# Patient Record
Sex: Male | Born: 1950 | Race: White | Hispanic: No | Marital: Married | Smoking: Former smoker
Health system: Southern US, Community
[De-identification: ages and names within clinical notes are randomized; demographics above are authoritative.]

## PROBLEM LIST (undated history)

## (undated) DIAGNOSIS — N400 Enlarged prostate without lower urinary tract symptoms: Secondary | ICD-10-CM

## (undated) DIAGNOSIS — M1990 Unspecified osteoarthritis, unspecified site: Secondary | ICD-10-CM

## (undated) DIAGNOSIS — R55 Syncope and collapse: Secondary | ICD-10-CM

## (undated) DIAGNOSIS — C20 Malignant neoplasm of rectum: Secondary | ICD-10-CM

## (undated) DIAGNOSIS — E785 Hyperlipidemia, unspecified: Secondary | ICD-10-CM

## (undated) DIAGNOSIS — K635 Polyp of colon: Secondary | ICD-10-CM

## (undated) HISTORY — DX: Malignant neoplasm of rectum: C20

## (undated) HISTORY — DX: Polyp of colon: K63.5

## (undated) HISTORY — DX: Unspecified osteoarthritis, unspecified site: M19.90

## (undated) HISTORY — PX: RECTAL SURGERY: SHX760

---

## 2005-04-22 ENCOUNTER — Encounter (INDEPENDENT_AMBULATORY_CARE_PROVIDER_SITE_OTHER): Payer: Self-pay | Admitting: *Deleted

## 2005-04-22 ENCOUNTER — Encounter (INDEPENDENT_AMBULATORY_CARE_PROVIDER_SITE_OTHER): Payer: Self-pay | Admitting: Specialist

## 2005-04-22 ENCOUNTER — Ambulatory Visit (HOSPITAL_COMMUNITY): Admission: RE | Admit: 2005-04-22 | Discharge: 2005-04-22 | Payer: Self-pay | Admitting: *Deleted

## 2006-03-31 ENCOUNTER — Encounter (INDEPENDENT_AMBULATORY_CARE_PROVIDER_SITE_OTHER): Payer: Self-pay | Admitting: Specialist

## 2006-03-31 ENCOUNTER — Ambulatory Visit (HOSPITAL_COMMUNITY): Admission: RE | Admit: 2006-03-31 | Discharge: 2006-03-31 | Payer: Self-pay | Admitting: *Deleted

## 2006-03-31 ENCOUNTER — Encounter (INDEPENDENT_AMBULATORY_CARE_PROVIDER_SITE_OTHER): Payer: Self-pay | Admitting: *Deleted

## 2006-04-06 ENCOUNTER — Encounter: Payer: Self-pay | Admitting: Gastroenterology

## 2006-04-06 ENCOUNTER — Ambulatory Visit (HOSPITAL_COMMUNITY): Admission: RE | Admit: 2006-04-06 | Discharge: 2006-04-06 | Payer: Self-pay | Admitting: Gastroenterology

## 2006-04-14 ENCOUNTER — Ambulatory Visit: Payer: Self-pay | Admitting: Gastroenterology

## 2006-04-27 ENCOUNTER — Inpatient Hospital Stay (HOSPITAL_COMMUNITY): Admission: RE | Admit: 2006-04-27 | Discharge: 2006-05-03 | Payer: Self-pay

## 2006-04-27 ENCOUNTER — Encounter (INDEPENDENT_AMBULATORY_CARE_PROVIDER_SITE_OTHER): Payer: Self-pay | Admitting: *Deleted

## 2006-04-27 ENCOUNTER — Encounter (INDEPENDENT_AMBULATORY_CARE_PROVIDER_SITE_OTHER): Payer: Self-pay | Admitting: Specialist

## 2007-04-01 ENCOUNTER — Encounter: Admission: RE | Admit: 2007-04-01 | Discharge: 2007-04-01 | Payer: Self-pay | Admitting: *Deleted

## 2007-04-05 ENCOUNTER — Ambulatory Visit (HOSPITAL_COMMUNITY): Admission: RE | Admit: 2007-04-05 | Discharge: 2007-04-05 | Payer: Self-pay | Admitting: *Deleted

## 2007-07-08 ENCOUNTER — Encounter (INDEPENDENT_AMBULATORY_CARE_PROVIDER_SITE_OTHER): Payer: Self-pay | Admitting: *Deleted

## 2007-07-08 ENCOUNTER — Ambulatory Visit (HOSPITAL_COMMUNITY): Admission: RE | Admit: 2007-07-08 | Discharge: 2007-07-08 | Payer: Self-pay | Admitting: *Deleted

## 2007-07-15 ENCOUNTER — Encounter: Admission: RE | Admit: 2007-07-15 | Discharge: 2007-07-15 | Payer: Self-pay | Admitting: *Deleted

## 2007-07-22 ENCOUNTER — Ambulatory Visit (HOSPITAL_COMMUNITY): Admission: RE | Admit: 2007-07-22 | Discharge: 2007-07-22 | Payer: Self-pay | Admitting: *Deleted

## 2007-08-15 ENCOUNTER — Encounter (INDEPENDENT_AMBULATORY_CARE_PROVIDER_SITE_OTHER): Payer: Self-pay | Admitting: Interventional Radiology

## 2007-08-15 ENCOUNTER — Encounter (INDEPENDENT_AMBULATORY_CARE_PROVIDER_SITE_OTHER): Payer: Self-pay | Admitting: *Deleted

## 2007-08-15 ENCOUNTER — Ambulatory Visit (HOSPITAL_COMMUNITY): Admission: RE | Admit: 2007-08-15 | Discharge: 2007-08-15 | Payer: Self-pay | Admitting: *Deleted

## 2008-02-17 ENCOUNTER — Encounter: Admission: RE | Admit: 2008-02-17 | Discharge: 2008-02-17 | Payer: Self-pay | Admitting: *Deleted

## 2008-07-27 ENCOUNTER — Encounter (INDEPENDENT_AMBULATORY_CARE_PROVIDER_SITE_OTHER): Payer: Self-pay | Admitting: *Deleted

## 2008-07-27 ENCOUNTER — Ambulatory Visit (HOSPITAL_COMMUNITY): Admission: RE | Admit: 2008-07-27 | Discharge: 2008-07-27 | Payer: Self-pay | Admitting: *Deleted

## 2008-09-21 ENCOUNTER — Encounter: Admission: RE | Admit: 2008-09-21 | Discharge: 2008-09-21 | Payer: Self-pay | Admitting: *Deleted

## 2009-07-17 ENCOUNTER — Encounter (INDEPENDENT_AMBULATORY_CARE_PROVIDER_SITE_OTHER): Payer: Self-pay | Admitting: *Deleted

## 2009-10-11 ENCOUNTER — Telehealth: Payer: Self-pay | Admitting: Gastroenterology

## 2009-11-22 ENCOUNTER — Encounter: Admission: RE | Admit: 2009-11-22 | Discharge: 2009-11-22 | Payer: Self-pay | Admitting: Internal Medicine

## 2009-11-22 ENCOUNTER — Encounter (INDEPENDENT_AMBULATORY_CARE_PROVIDER_SITE_OTHER): Payer: Self-pay | Admitting: *Deleted

## 2009-12-05 ENCOUNTER — Encounter: Admission: RE | Admit: 2009-12-05 | Discharge: 2009-12-05 | Payer: Self-pay | Admitting: Internal Medicine

## 2010-04-29 ENCOUNTER — Encounter (INDEPENDENT_AMBULATORY_CARE_PROVIDER_SITE_OTHER): Payer: Self-pay | Admitting: *Deleted

## 2010-05-11 ENCOUNTER — Encounter: Payer: Self-pay | Admitting: *Deleted

## 2010-05-15 ENCOUNTER — Encounter (INDEPENDENT_AMBULATORY_CARE_PROVIDER_SITE_OTHER): Payer: Self-pay | Admitting: *Deleted

## 2010-05-16 ENCOUNTER — Ambulatory Visit
Admission: RE | Admit: 2010-05-16 | Discharge: 2010-05-16 | Payer: Self-pay | Source: Home / Self Care | Attending: Gastroenterology | Admitting: Gastroenterology

## 2010-05-20 ENCOUNTER — Telehealth: Payer: Self-pay | Admitting: Gastroenterology

## 2010-05-20 NOTE — Procedures (Signed)
Summary: EUS   EUS  Procedure date:  04/02/2006  Findings:      Location: [facility]    EUS  Procedure date:  04/02/2006  Findings:      Location: [facility]   Patient Name: [name], [name] MRN: [id number] Procedure Procedures: Flex Sig with EUS Personnel: Endoscopist: Rachael Fee, MD.  Referred By: Sabino Gasser, MD.  Exam Location: Exam performed in Endoscopy Suite. Outpatient  Patient Consent: Procedure, Alternatives, Risks and Benefits discussed, consent obtained, from patient. Consent was obtained by the RN.  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  History  Current Medications: Patient is not currently taking Coumadin.  Pre-Exam Physical: Performed Apr 06, 2006. Cardio-pulmonary exam, Abdominal exam, Mental status exam WNL.  Exam Exam: Images were taken.  Patient: ASA Classification: II. Tolerance: good.  Sedation Meds:  ~OBJECTIVE5Sedation Meds Patient assessed and found to be appropriate for moderate (conscious) sedation. Fentanyl 50 mcg. given IV. Versed 5 mg. given IV.  Monitoring: BP and pulse monitoring done. Oximetry was used. Supplemental O2 given.  EUS Scopes: Radial Echoendoscope used   Comments: Sigmoidoscopic exam: 1. Non circumferential mass in rectum. This occupies approximately 1/3 of the circumference of lumen, is 4cm across, and the distal edge is 4-5cm from anal verge.  This was recently biopsied and proven to be an adenocarcinoma.  EUS exam: 1. The mass above corresponds to hypoechoic mass located along anterior rectal wall.  The mass invades into and focally expands the muscularis propria but does not clearly invade through this layer (uT3). 2. No perirectal adenopathy (uN0). Assessment Colon Abnormal examination, see findings above.  Comments: uT2N0 rectal adenocarcinoma, located 4-5cm from anal verge, occupying 1/3 the circumference of  rectum along anterior rectal wall. Events  Unplanned Intervention: No intervention was required.  Unplanned Events: There were no complications. Plans Comments: Neoadjuvant chemo-xrt is not indicated by this exam.  I will communicate this with Drs. Rico Ala. This report was created from the original endoscopy report, which was reviewed and signed by the above listed endoscopist.

## 2010-05-20 NOTE — Procedures (Signed)
Summary: Colon   Colonoscopy  Procedure date:  07/27/2008  Findings:      Location:  [facility].   NAME:  [name], [name]                  ACCOUNT NO.:  [id number]      MEDICAL RECORD NO.:  [id number]          PATIENT TYPE:  AMB      LOCATION:  ENDO                         FACILITY:  [facility] [address]      PHYSICIAN:  Georgiana Spinner, M.D.    DATE OF BIRTH:  26-Dec-1950      DATE OF PROCEDURE:   DATE OF DISCHARGE:                                  OPERATIVE REPORT      PROCEDURE:  Colonoscopy.      INDICATIONS:  Colon cancer.      ANESTHESIA:  Fentanyl 60 mcg, Versed 6 mg.      PROCEDURE:  With the patient mildly sedated in the recumbent position,   the Pentax videoscopic pediatric colonoscope was inserted into the left   lower quadrant ostomy, passed under direct vision with pressure applied   to reach the cecum, identified by the ileocecal valve and appendiceal   orifice, both of which were photographed.  From this point the   colonoscope was slowly withdrawn, taking circumferential views of the   colonic mucosa, stopping in the transverse colon, where 2 polyps were   seen nearly adjacent to each other.  One was removed using snare cautery   technique with a setting of 20/150 blended current; the other with hot   biopsy forceps technique, same setting.  Both were retrieved for   pathology.  The endoscope was then withdrawn all the way to the ostomy   and then withdrawn.  The patient's vital signs, pulse oximeter remained   stable.  The patient tolerated the procedure well without apparent   complications.      FINDINGS:  Two small polyps as described above in the transverse colon.      Await biopsy report.  The patient will call me for results and follow up   with me as needed as an outpatient.                  ______________________________   Georgiana Spinner, M.D.            GMO/MEDQ  D:  07/27/2008  T:  07/27/2008  Job:  [id number]

## 2010-05-20 NOTE — Letter (Signed)
Summary: New Patient letter  [facility]  [address] [place], [place] [address]   Phone: [phone]  Fax: [phone]       07/17/2009 MRN: [id number]  [name] [address] [place], [place]  [address]  Dear [name],  Welcome to the Gastroenterology Division at [facility].    You are scheduled to see Dr. Jarold Motto on 08-30-09 at 8:30a.m. on the 3rd floor at [facility], [address]  We ask that you try to arrive at our office 15 minutes prior to your appointment time to allow for check-in.  We would like you to complete the enclosed self-administered evaluation form prior to your visit and bring it with you on the day of your appointment.  We will review it with you.  Also, please bring a complete list of all your medications or, if you prefer, bring the medication bottles and we will list them.  Please bring your insurance card so that we may make a copy of it.  If your insurance requires a referral to see a specialist, please bring your referral form from your primary care physician.  Co-payments are due at the time of your visit and may be paid by cash, check or credit card.     Your office visit will consist of a consult with your physician (includes a physical exam), any laboratory testing he/she may order, scheduling of any necessary diagnostic testing (e.g. x-ray, ultrasound, CT-scan), and scheduling of a procedure (e.g. Endoscopy, Colonoscopy) if required.  Please allow enough time on your schedule to allow for any/all of these possibilities.    If you cannot keep your appointment, please call [phone] to cancel or reschedule prior to your appointment date.  This allows [place] the opportunity to schedule an appointment for another patient in need of care.  If you do not cancel or reschedule by 5 p.m. the business day prior to your appointment date, you will be charged a $50.00 late cancellation/no-show fee.    Thank you for choosing [facility] for your medical needs.  We appreciate the opportunity to care for you.  Please visit [place] at our website  to learn more about our practice.                     Sincerely,                                                             The Gastroenterology Division

## 2010-05-20 NOTE — Progress Notes (Signed)
Summary: triage  Phone Note Call from Patient Call back at [phone]  (wife's cell)   Caller: wife, Scarlette Calico Call For: Dr. Russella Dar Reason for Call: Talk to Nurse Summary of Call: upon resch pt from [facility] list, wife would like to speak to a nurse about pt being worked in... doesnt want husband to wait until next available in late August... wife concerned b/c she says pt is due for a CAT scan which cannot be ordered until Dr. Russella Dar sees pt Initial call taken by: Vallarie Mare,  October 11, 2009 10:19 AM  Follow-up for Phone Call        Patient  has hx with Dr Virginia Rochester and has colon CA.  He gets a CT scan annually and she reports he is due now.  Patient  wants to see Dr Russella Dar.  No current problems.  he is rescheduled for 11/14/09 10:15 Follow-up by: Darcey Nora RN, CGRN,  October 11, 2009 10:40 AM

## 2010-05-20 NOTE — Procedures (Signed)
Summary: colon   Colonoscopy  Procedure date:  03/31/2006  Findings:      Location:  [facility].   NAME:  [name], [name]                  ACCOUNT NO.:  [id number]   MEDICAL RECORD NO.:  [id number]          PATIENT TYPE:  AMB   LOCATION:  ENDO                         FACILITY:  [facility]   PHYSICIAN:  Georgiana Spinner, M.D.    DATE OF BIRTH:  Sep 04, 1950   DATE OF PROCEDURE:  DATE OF DISCHARGE:                               OPERATIVE REPORT   PROCEDURE:  Colonoscopy.   INDICATIONS:  Rectal bleeding.   ANESTHESIA:  Demerol 25 mg and Versed 1.5 mg.   DESCRIPTION OF PROCEDURE:  With the patient mildly sedated in the left  lateral decubitus position, a rectal examination was performed and at  the tip of my finger, I could feel a polypoid lesion.  Subsequently the  Olympus videoscopic colonoscope was inserted to the rectum, passed under  direct vision to the cecum, identified by ileocecal valve and  appendiceal orifice, both of which were photographed.  Adjacent to the  cecum was a polyp.  It was over a cm in size.  It was photographed and  it was removed using snare cautery technique, setting of 20/200 blended  current.  There was good residual base with all of the polyp removed and  this was photographed.  From this point, the colonoscope was then slowly  withdrawn, taking circumferential views of the colonic mucosa, stopping  next in the descending colon where another polyp was seen.  It, too, was  removed, using snare cautery technique after being photographed again  with the same setting.  The tissue was suctioned into the endoscope for  retrieval and placed in a separate bottle.  We next stopped at  approximately 25 cm from the anal verge, at which point a large polyp  was seen on a stalk and it was then snared at the stalk and removed  using snare cautery technique setting of 20/200 blended current.  This  was suctioned to the tip of the endoscope and withdrawn.  It was  retrieved.  The endoscope was reinserted to this level and withdrawn to  the rectum, where the previously felt mass was noted.  It was  photographed in direct and retroflexed views.  It seemed to be  approximately 5 cm from the anal verge where hemorrhoids were also noted  on retroflexed view.  The endoscope was straightened and subsequently  biopsies were taken.  The endoscope was withdrawn; the patient's vital  signs, pulse oximeter remained stable.  The patient tolerated the  procedure well without apparent complications.   FINDINGS:  Polyp of cecum, polyp of descending colon, polyp at 25 cm  from the anal verge and mass in the rectum.   PLAN:  Await biopsy report but presume that the latter mass in the  rectum is a malignancy.  We will proceed with CT scan of the abdomen and  pelvis and have patient call me for results of biopsy and follow up with  me as an outpatient.  ______________________________  Georgiana Spinner, M.D.     GMO/MEDQ  D:  03/31/2006  T:  04/01/2006  Job:  [id number]

## 2010-05-20 NOTE — Procedures (Signed)
Summary: EGD   EGD  Procedure date:  04/22/2005  Findings:      Location: [facility]   NAME:  [name], [name]                  ACCOUNT NO.:  [id number]   MEDICAL RECORD NO.:  [id number]          PATIENT TYPE:  AMB   LOCATION:  ENDO                         FACILITY:  [facility]   PHYSICIAN:  Georgiana Spinner, M.D.    DATE OF BIRTH:  12-Apr-1951   DATE OF PROCEDURE:  04/22/2005  DATE OF DISCHARGE:                                 OPERATIVE REPORT   PROCEDURE:  Upper endoscopy with biopsy and dilation.   INDICATIONS FOR PROCEDURE:  Dysphagia with gastroesophageal reflux disease.   ANESTHESIA:  Demerol 50, Versed 5 mg   DESCRIPTION OF PROCEDURE:  With the patient mildly sedated in the left  lateral decubitus position, the Olympus videoscopic endoscope was inserted  in the mouth and passed under direct vision through the esophagus which  appeared normal until we reached the distal esophagus and there was a  section of Barrett's photographed. We entered into the stomach. The fundus,  body, antrum, duodenal bulb, and second portion of duodenum were visualized.  From this point, the endoscope was slowly withdrawn taking circumferential  views of the duodenal mucosa until the endoscope had been pulled back into  the stomach, placed in retroflexion to view the stomach from below. The  endoscope was then straightened and a guidewire was passed. The endoscope  was withdrawn. Subsequently Savary dilators 15, 17 and 18 were passed rather  easily with no blood seen on any of the dilators. With the last, the  guidewire was removed, the endoscope was reinserted to the stomach and then  withdrawn after taking biopsies of erythematous changes of the fundus and of  the squamocolumnar junction to rule out Barrett's esophagus. The endoscope  was withdrawn. The patient's vital signs and pulse oximeter remained stable.  The patient tolerated the procedure well without apparent complications.   FINDINGS:  Question of Barrett's esophagus, erythema of gastric fundus,  dilation of distal esophagus with 15, 17 and 18 Savary.   PLAN:  Await biopsy report for clinical response. The patient will follow-up  for results and follow-up with me as an outpatient.           ______________________________  Georgiana Spinner, M.D.     GMO/MEDQ  D:  04/22/2005  T:  04/22/2005  Job:  [id number]

## 2010-05-20 NOTE — Procedures (Signed)
Summary: EGD   EGD  Procedure date:  03/31/2006  Findings:      Location: [facility]   NAME:  QUENTEN, NAWAZ                  ACCOUNT NO.:  [id number]   MEDICAL RECORD NO.:  [id number]          PATIENT TYPE:  AMB   LOCATION:  ENDO                         FACILITY:  [facility]   PHYSICIAN:  Georgiana Spinner, M.D.    DATE OF BIRTH:  1950/11/02   DATE OF PROCEDURE:  03/31/2006  DATE OF DISCHARGE:                               OPERATIVE REPORT   PROCEDURE:  Upper endoscopy.   INDICATIONS:  GERD, rule out Barrett esophagus.   ANESTHESIA:  Demerol 50, Versed 6 mg.   PROCEDURE:  With the patient mildly sedated in the left lateral  decubitus position, the Pentax videoscopic endoscope was inserted in the  mouth, passed under direct vision through the esophagus, which appeared  normal until we reached the distal esophagus.  There was a question of  Barrett esophagus, photographed and biopsies taken.  We entered into the  stomach, fundus, body, antrum, duodenal bulb, second portion of the  duodenum were visualized from this point and the scope was slowly  withdrawn, taking circumferential views of the duodenal mucosa until the  endoscope had been pulled back into the stomach, placed in retroflexion,  viewed the stomach from below.  The endoscope was straightened and  withdrawn, taking circumferential views of the remaining gastric and  esophageal mucosa.  The patient's vital signs and pulse oximeter  remained stable.  The patient tolerated the procedure well without  apparent complications.   FINDINGS:  Question of Barrett esophagus, biopsied.  Wait biopsy report.  The patient will call me for results and follow up with me as an  outpatient.  Proceed to colonoscopy is planned.           ______________________________  Georgiana Spinner, M.D.     GMO/MEDQ  D:  03/31/2006  T:  04/01/2006  Job:  [id number]

## 2010-05-20 NOTE — Op Note (Signed)
Summary: Rectal Cancer   NAME:  [name], [name]                  ACCOUNT NO.:  [id number]   MEDICAL RECORD NO.:  [id number]          PATIENT TYPE:  INP   LOCATION:  W295                         FACILITY:  [facility]   PHYSICIAN:  Lebron Conners, M.D.   DATE OF BIRTH:  05-28-1950   DATE OF PROCEDURE:  04/27/2006  DATE OF DISCHARGE:                               OPERATIVE REPORT   PREOPERATIVE DIAGNOSIS:  Carcinoma of the rectum.   POSTOPERATIVE DIAGNOSIS:  Carcinoma of the rectum.   OPERATION:  Abdominoperineal resection of the rectum with colostomy.   SURGEON:  Dr. Lebron Conners.   ASSISTANT:  Dr. Consuello Bossier.   ANESTHESIA:  General.   BLOOD LOSS:  About 150 mL.   COMPLICATIONS:  None. Patient PACU in good condition.   DESCRIPTION OF PROCEDURE:  After the patient was given general  anesthesia and was well monitored and had routine preparation and  draping of the abdomen and perineum with positioning in the yellow fin  stirrups, I made a lower midline incision from just above the umbilicus  to just above the symphysis pubis.  I took the dissection down through  subcutaneous tissue and opened the fascia longitudinally in the midline  with the Bovie and then bluntly entered the peritoneal cavity and opened  it throughout the length of the wound.  I saw no evidence of any  peritoneal metastatic disease.  I felt the liver and upper abdomen and  found no evidence of metastatic disease.  Feeling in the pelvis, I could  not feel a tumor.  This was expected to be that way because he was known  to have a low mid rectal tumor.  I had the patient positioned in  Trendelenburg position and packed the small bowel upward and then  mobilized the sigmoid colon by incising the fascia along the distal  descending colon and mobilizing the sigmoid.  I dissected in and found  the left ureter and subsequently found the right ureter as well and took  care not to harm them.  I then divided the colon  slightly distal to the  mid sigmoid using a cutting stapler.  I then segmentally divided the  mesentery straight back to the sacrum using ligatures and used the  LigaSure device.  I then dissected bluntly down the hollow of the sacrum  and relatively avascular plane.  I dissected further laterally using  primarily the LigaSure as the means of control of bleeding and division  of the tissue.  I continued the dissection downward and into the pelvis  and incised the peritoneum just anterior to the rectum in the cul-de-sac  and developed a plane of dissection immediately anterior to the rectum.  Dividing further tissues with the LigaSure anterolaterally, I mobilized  the rectum maximally and still could not definitely feel the tumor.  Dr.  Zachery Dakins then went below and performed a proctoscopy and I found that  despite extended period of dissection into the pelvis that I  simply  could not develop enough length of the rectum to place a stapling device  below the tumor.  I made the commitment then to perform an  abdominoperineal resection.  I finished the dissection from above as far  down as I could see and then exposed perineum.  I made an elliptical  incision around the rectum to dissect down to the subcutaneous tissues  and levator musculatures fat.  I dissected up toward the coccyx and  incised the fascia and muscle right at the tip of the coccyx and entered  the pelvic cavity where dissection had taken place.  I dissected down  about 3/4 of the way around the rectum utilizing the LigaSure to divide  the muscle and tissues.  When I felt I had enough room, I passed the  proximal bowel through and pulled down on both the anorectum and the  rectosigmoid and finished the dissection anteriorly by dissecting  immediately behind the prostate gland.  After I had removed the  anorectum and rectosigmoid, I checked and saw that all of the rectum was  intact and had made no holes in it.  I got  hemostasis with the cautery.  I placed two suction drains brought out through the anterolateral  perineum and secured to the skin with 2-0 silk, placed them in the  hollow of the sacrum.  I closed the levator musculature with running 2-0  Vicryl and closed the subcutaneous tissues with running 2-0 Vicryl and  closed the perineal skin with staples.  I then went back above and  positioned the drains comfortably and thoroughly irrigated the pelvis.  I packed a couple of small bleeding areas on the sacrum with Surgicel  and then I closed the peritoneum with running 2-0 Vicryl.  It closed  nicely.  I replaced the small bowel down into the pelvis.  I checked to  make sure that I had adequate mobilization of the sigmoid colon and felt  that I did.  I chose a site for the colostomy at the lateral edge of the  rectus muscle just above the level of the umbilicus and cut out about a  3 cm piece of skin and also removed some subcutaneous tissue.  I then  made a cruciate incision in the anterior rectus sheath, divided a deal  of the rectus with the cautery and then incised the posterior sheath and  dilated it up to 3 fingerbreadths.  I pulled the proximal bowel through  taking care not to twist it and it stayed up nicely.  Nevertheless, I  put two 2-0 silk sutures in the seromuscular layers of bowel and in the  anterior abdominal wall to assure that it stayed in place.  After  obtaining correct sponge, needle and instrument counts, I closed the  fascia with running #1 PDS and then irrigated the subcutaneous tissues  and closed the skin with staples.  I then matured the colostomy with a  combination of running and interrupted 3-0 Vicryl suture.  The colostomy  lay nice and flat and had no areas of apparent dyskinesia.  I placed my  finger through nicely and there seemed to be no twisting of bowel.  I  applied a colostomy appliance.  We applied a bandage to the wound and the patient went to PACU in  stable condition.      Lebron Conners, M.D.  Electronically Signed     WB/MEDQ  D:  04/27/2006  T:  04/27/2006  Job:  [id number]   cc:   Georgiana Spinner, M.D.  Fax: [phone]

## 2010-05-20 NOTE — Procedures (Signed)
Summary: Colon   Colonoscopy  Procedure date:  07/08/2007  Findings:      Location:  [facility].   NAME:  [name], [name]                  ACCOUNT NO.:  [id number]      MEDICAL RECORD NO.:  [id number]          PATIENT TYPE:  AMB      LOCATION:                               FACILITY[facility]  [facility]      PHYSICIAN:  Georgiana Spinner, M.D.    DATE OF BIRTH:  [birth year]      DATE OF PROCEDURE:   DATE OF DISCHARGE:                                  OPERATIVE REPORT      PROCEDURE:  Colonoscopy.      INDICATIONS:  Colon cancer, colon polyps.      ANESTHESIA:  Fentanyl 75 mcg, Versed 5 mg.      PROCEDURE:  With the patient mildly sedated in the left lateral   decubitus position, the Pentax videoscopic colonoscope was inserted into   the ostomy and passed under direct vision to the cecum, identified by   the ileocecal valve and appendiceal orifice.  Photographs were taken.   There was a polyp in the cecum which was removed using snare cautery   technique setting of 20/150 blended current.  Tissue was retrieved by   suctioning it through the endoscope.  From this point the colonoscope   was slowly withdrawn, taking circumferential views of the colonic   mucosa, stopping in the descending colon at 2 spots, one at 50 cm, the   other at 20 cm from the ostomy, at which 2 polyps were seen.  Both were   removed.  The distal one was removed using hot biopsy forceps technique,   the proximal using just regular biopsy forceps.  The endoscope was then   withdrawn.  The patient's vital signs and pulse oximetry remained   stable.  The patient tolerated the procedure well without apparent   complications.      FINDINGS:  Polyps as described above in the cecum at 50 cm from the anal   verge which was biopsied using the biopsy forceps, and one at 20 cm from   the ostomy which was removed using hot biopsy forceps technique.      PLAN:  Await biopsy reports.  The patient will call me for results and   follow up with me as needed as an outpatient.                  ______________________________   Georgiana Spinner, M.D.            GMO/MEDQ  D:  07/08/2007  T:  07/08/2007  Job:  [id number]

## 2010-05-22 NOTE — Miscellaneous (Signed)
Summary: [facility] Previsit/prep  Clinical Lists Changes  Medications: Added new medication of MOVIPREP 100 GM  SOLR (PEG-KCL-NACL-NASULF-NA ASC-C) As per prep instructions. - Signed Rx of MOVIPREP 100 GM  SOLR (PEG-KCL-NACL-NASULF-NA ASC-C) As per prep instructions.;  #1 x 0;  Signed;  Entered by: Wyona Almas RN;  Authorized by: Rachael Fee MD;  Method used: Electronically to [id number] [address] [place], [place]  [address], Ph: [phone], Fax: [phone] Observations: Added new observation of NKA: T (05/16/2010 13:51)    Prescriptions: MOVIPREP 100 GM  SOLR (PEG-KCL-NACL-NASULF-NA ASC-C) As per prep instructions.  #1 x 0   Entered by:   Wyona Almas RN   Authorized by:   Rachael Fee MD   Signed by:   Wyona Almas RN on 05/16/2010   Method used:   Electronically to        [id number] (retail)       [address] [place], [place]  [address]       Ph: [phone]       Fax: [phone]   RxID:   [id number]   Appended Document: [facility] Previsit/prep Pt. refused to sign cancellation agreement.  Noted on form, discussed with Dixie who will call pt. if Aurea Graff says he must sign.

## 2010-05-22 NOTE — Letter (Signed)
Summary: Pre Visit Letter Revised  [facility]  [address] [place], [place] [address]   Phone: [phone]  Fax: [phone]        04/29/2010 MRN: [id number] [name] [address] [place], [place]  [address]             Procedure Date:  05-30-10   Welcome to the Gastroenterology Division at [facility].    You are scheduled to see a nurse for your pre-procedure visit on 05-16-10 at 2:00p.m. on the 3rd floor at [facility], [address]  We ask that you try to arrive at our office 15 minutes prior to your appointment time to allow for check-in.  Please take a minute to review the attached form.  If you answer "Yes" to one or more of the questions on the first page, we ask that you call the person listed at your earliest opportunity.  If you answer "No" to all of the questions, please complete the rest of the form and bring it to your appointment.    Your nurse visit will consist of discussing your medical and surgical history, your immediate family medical history, and your medications.   If you are unable to list all of your medications on the form, please bring the medication bottles to your appointment and we will list them.  We will need to be aware of both prescribed and over the counter drugs.  We will need to know exact dosage information as well.    Please be prepared to read and sign documents such as consent forms, a financial agreement, and acknowledgement forms.  If necessary, and with your consent, a friend or relative is welcome to sit-in on the nurse visit with you.  Please bring your insurance card so that we may make a copy of it.  If your insurance requires a referral to see a specialist, please bring your referral form from your primary care physician.  No co-pay is required for this nurse visit.     If you cannot keep your appointment, please call [phone] to cancel or reschedule prior to your appointment date.  This allows [place] the  opportunity to schedule an appointment for another patient in need of care.    Thank you for choosing  Gastroenterology for your medical needs.  We appreciate the opportunity to care for you.  Please visit [place] at our website  to learn more about our practice.  Sincerely, The Gastroenterology Division

## 2010-05-22 NOTE — Letter (Signed)
Summary: [facility] Instructions  [facility]  [address] [place], [place] [address]   Phone: [phone]  Fax: [phone]       [name]    Nov 10, 1950    MRN: [id number]        Procedure Day Dorna Bloom:  Farrell Ours  05/30/10     Arrival Time:  1:30PM     Procedure Time:  2:30PM     Location of Procedure:                    Juliann Pares _  [facility] (4th Floor)                      PREPARATION FOR COLONOSCOPY WITH MOVIPREP   Starting 5 days prior to your procedure 05/25/10 do not eat nuts, seeds, popcorn, corn, beans, peas,  salads, or any raw vegetables.  Do not take any fiber supplements (e.g. Metamucil, Citrucel, and Benefiber).  THE DAY BEFORE YOUR PROCEDURE         DATE: 05/29/10  DAY: THURSDAY  1.  Drink clear liquids the entire day-NO SOLID FOOD  2.  Do not drink anything colored red or purple.  Avoid juices with pulp.  No orange juice.  3.  Drink at least 64 oz. (8 glasses) of fluid/clear liquids during the day to prevent dehydration and help the prep work efficiently.  CLEAR LIQUIDS INCLUDE: Water Jello Ice Popsicles Tea (sugar ok, no milk/cream) Powdered fruit flavored drinks Coffee (sugar ok, no milk/cream) Gatorade Juice: apple, white grape, white cranberry  Lemonade Clear bullion, consomm, broth Carbonated beverages (any kind) Strained chicken noodle soup Hard Candy                             4.  In the morning, mix first dose of MoviPrep solution:    Empty 1 Pouch A and 1 Pouch B into the disposable container    Add lukewarm drinking water to the top line of the container. Mix to dissolve    Refrigerate (mixed solution should be used within 24 hrs)  5.  Begin drinking the prep at 5:00 p.m. The MoviPrep container is divided by 4 marks.   Every 15 minutes drink the solution down to the next mark (approximately 8 oz) until the full liter is complete.   6.  Follow completed prep with 16 oz of clear liquid of your choice (Nothing red or  purple).  Continue to drink clear liquids until bedtime.  7.  Before going to bed, mix second dose of MoviPrep solution:    Empty 1 Pouch A and 1 Pouch B into the disposable container    Add lukewarm drinking water to the top line of the container. Mix to dissolve    Refrigerate  THE DAY OF YOUR PROCEDURE      DATE: 05/30/10  DAY: FRIDAY  Beginning at 9:30AM (5 hours before procedure):         1. Every 15 minutes, drink the solution down to the next mark (approx 8 oz) until the full liter is complete.  2. Follow completed prep with 16 oz. of clear liquid of your choice.    3. You may drink clear liquids until 12:30PM (2 HOURS BEFORE PROCEDURE).   MEDICATION INSTRUCTIONS  Unless otherwise instructed, you should take regular prescription medications with a small sip of water   as early as possible the morning of your  procedure.         OTHER INSTRUCTIONS  You will need a responsible adult at least 62 years of age to accompany you and drive you home.   This person must remain in the waiting room during your procedure.  Wear loose fitting clothing that is easily removed.  Leave jewelry and other valuables at home.  However, you may wish to bring a book to read or  an iPod/MP3 player to listen to music as you wait for your procedure to start.  Remove all body piercing jewelry and leave at home.  Total time from sign-in until discharge is approximately 2-3 hours.  You should go home directly after your procedure and rest.  You can resume normal activities the  day after your procedure.  The day of your procedure you should not:   Drive   Make legal decisions   Operate machinery   Drink alcohol   Return to work  You will receive specific instructions about eating, activities and medications before you leave.    The above instructions have been reviewed and explained to me by   Wyona Almas RN  May 16, 2010 2:19 PM     I fully understand and can  verbalize these instructions _____________________________ Date _________

## 2010-05-28 NOTE — Progress Notes (Signed)
Summary: prep question  Phone Note Call from Patient   Caller: Spouse Drenda Freeze Call For: Dr Christella Hartigan Reason for Call: Talk to Nurse Summary of Call: Patient would like to speak to nurse regarding prep Initial call taken by: Tawni Levy,  May 20, 2010 8:36 AM  Follow-up for Phone Call        Spoke with pt at work and he requested we cancel his appt for the colonoscopy on 05/30/10 with Dr Christella Hartigan, since he does not need to have it done until 2013. I will cancel the appt for the colonoscopy as requested per pt.Ulis Rias RN  May 20, 2010 11:02 AM

## 2010-05-30 ENCOUNTER — Other Ambulatory Visit: Payer: Self-pay | Admitting: Gastroenterology

## 2010-09-02 NOTE — Op Note (Signed)
[name], [name] NO.:  [id number]   MEDICAL RECORD NO.:  [id number]          PATIENT TYPE:  AMB   LOCATION:                               FACILITY:  [facility]   PHYSICIAN:  Georgiana Spinner, M.D.    DATE OF BIRTH:  [birth year]   DATE OF PROCEDURE:  DATE OF DISCHARGE:                               OPERATIVE REPORT   PROCEDURE:  Colonoscopy.   INDICATIONS:  Colon cancer, colon polyps.   ANESTHESIA:  Fentanyl 75 mcg, Versed 5 mg.   PROCEDURE:  With the patient mildly sedated in the left lateral  decubitus position, the Pentax videoscopic colonoscope was inserted into  the ostomy and passed under direct vision to the cecum, identified by  the ileocecal valve and appendiceal orifice.  Photographs were taken.  There was a polyp in the cecum which was removed using snare cautery  technique setting of 20/150 blended current.  Tissue was retrieved by  suctioning it through the endoscope.  From this point the colonoscope  was slowly withdrawn, taking circumferential views of the colonic  mucosa, stopping in the descending colon at 2 spots, one at 50 cm, the  other at 20 cm from the ostomy, at which 2 polyps were seen.  Both were  removed.  The distal one was removed using hot biopsy forceps technique,  the proximal using just regular biopsy forceps.  The endoscope was then  withdrawn.  The patient's vital signs and pulse oximetry remained  stable.  The patient tolerated the procedure well without apparent  complications.   FINDINGS:  Polyps as described above in the cecum at 50 cm from the anal  verge which was biopsied using the biopsy forceps, and one at 20 cm from  the ostomy which was removed using hot biopsy forceps technique.   PLAN:  Await biopsy reports.  The patient will call me for results and  follow up with me as needed as an outpatient.           ______________________________  Georgiana Spinner, M.D.     GMO/MEDQ  D:  07/08/2007  T:  07/08/2007  Job:   [id number]

## 2010-09-02 NOTE — Op Note (Signed)
[name], [name] NO.:  [id number]   MEDICAL RECORD NO.:  [id number]          PATIENT TYPE:  AMB   LOCATION:  ENDO                         FACILITY:  [facility]   PHYSICIAN:  Georgiana Spinner, M.D.    DATE OF BIRTH:  1950/09/06   DATE OF PROCEDURE:  DATE OF DISCHARGE:                               OPERATIVE REPORT   PROCEDURE:  Colonoscopy.   INDICATIONS:  Colon cancer.   ANESTHESIA:  Fentanyl 60 mcg, Versed 6 mg.   PROCEDURE:  With the patient mildly sedated in the recumbent position,  the Pentax videoscopic pediatric colonoscope was inserted into the left  lower quadrant ostomy, passed under direct vision with pressure applied  to reach the cecum, identified by the ileocecal valve and appendiceal  orifice, both of which were photographed.  From this point the  colonoscope was slowly withdrawn, taking circumferential views of the  colonic mucosa, stopping in the transverse colon, where 2 polyps were  seen nearly adjacent to each other.  One was removed using snare cautery  technique with a setting of 20/150 blended current; the other with hot  biopsy forceps technique, same setting.  Both were retrieved for  pathology.  The endoscope was then withdrawn all the way to the ostomy  and then withdrawn.  The patient's vital signs, pulse oximeter remained  stable.  The patient tolerated the procedure well without apparent  complications.   FINDINGS:  Two small polyps as described above in the transverse colon.   Await biopsy report.  The patient will call me for results and follow up  with me as needed as an outpatient.           ______________________________  Georgiana Spinner, M.D.     GMO/MEDQ  D:  07/27/2008  T:  07/27/2008  Job:  [id number]

## 2010-09-05 NOTE — Op Note (Signed)
NAMESAGAR, TENGAN                  ACCOUNT NO.:  [id number]   MEDICAL RECORD NO.:  [id number]          PATIENT TYPE:  AMB   LOCATION:  ENDO                         FACILITY:  [facility]   PHYSICIAN:  Georgiana Spinner, M.D.    DATE OF BIRTH:  1950-10-16   DATE OF PROCEDURE:  04/22/2005  DATE OF DISCHARGE:                                 OPERATIVE REPORT   PROCEDURE:  Upper endoscopy with biopsy and dilation.   INDICATIONS FOR PROCEDURE:  Dysphagia with gastroesophageal reflux disease.   ANESTHESIA:  Demerol 50, Versed 5 mg   DESCRIPTION OF PROCEDURE:  With the patient mildly sedated in the left  lateral decubitus position, the Olympus videoscopic endoscope was inserted  in the mouth and passed under direct vision through the esophagus which  appeared normal until we reached the distal esophagus and there was a  section of Barrett's photographed. We entered into the stomach. The fundus,  body, antrum, duodenal bulb, and second portion of duodenum were visualized.  From this point, the endoscope was slowly withdrawn taking circumferential  views of the duodenal mucosa until the endoscope had been pulled back into  the stomach, placed in retroflexion to view the stomach from below. The  endoscope was then straightened and a guidewire was passed. The endoscope  was withdrawn. Subsequently Savary dilators 15, 17 and 18 were passed rather  easily with no blood seen on any of the dilators. With the last, the  guidewire was removed, the endoscope was reinserted to the stomach and then  withdrawn after taking biopsies of erythematous changes of the fundus and of  the squamocolumnar junction to rule out Barrett's esophagus. The endoscope  was withdrawn. The patient's vital signs and pulse oximeter remained stable.  The patient tolerated the procedure well without apparent complications.   FINDINGS:  Question of Barrett's esophagus, erythema of gastric fundus,  dilation of distal esophagus with 15, 17  and 18 Savary.   PLAN:  Await biopsy report for clinical response. The patient will follow-up  for results and follow-up with me as an outpatient.           ______________________________  Georgiana Spinner, M.D.     GMO/MEDQ  D:  04/22/2005  T:  04/22/2005  Job:  [id number]

## 2010-09-05 NOTE — Op Note (Signed)
[name], [name] NO.:  [id number]   MEDICAL RECORD NO.:  [id number]          PATIENT TYPE:  INP   LOCATION:  X002                         FACILITY:  [facility]   PHYSICIAN:  Lebron Conners, M.D.   DATE OF BIRTH:  Oct 28, 1950   DATE OF PROCEDURE:  04/27/2006  DATE OF DISCHARGE:                               OPERATIVE REPORT   PREOPERATIVE DIAGNOSIS:  Carcinoma of the rectum.   POSTOPERATIVE DIAGNOSIS:  Carcinoma of the rectum.   OPERATION:  Abdominoperineal resection of the rectum with colostomy.   SURGEON:  Dr. Lebron Conners.   ASSISTANT:  Dr. Consuello Bossier.   ANESTHESIA:  General.   BLOOD LOSS:  About 150 mL.   COMPLICATIONS:  None. Patient PACU in good condition.   DESCRIPTION OF PROCEDURE:  After the patient was given general  anesthesia and was well monitored and had routine preparation and  draping of the abdomen and perineum with positioning in the yellow fin  stirrups, I made a lower midline incision from just above the umbilicus  to just above the symphysis pubis.  I took the dissection down through  subcutaneous tissue and opened the fascia longitudinally in the midline  with the Bovie and then bluntly entered the peritoneal cavity and opened  it throughout the length of the wound.  I saw no evidence of any  peritoneal metastatic disease.  I felt the liver and upper abdomen and  found no evidence of metastatic disease.  Feeling in the pelvis, I could  not feel a tumor.  This was expected to be that way because he was known  to have a low mid rectal tumor.  I had the patient positioned in  Trendelenburg position and packed the small bowel upward and then  mobilized the sigmoid colon by incising the fascia along the distal  descending colon and mobilizing the sigmoid.  I dissected in and found  the left ureter and subsequently found the right ureter as well and took  care not to harm them.  I then divided the colon slightly distal to the  mid sigmoid using a cutting stapler.  I then segmentally divided the  mesentery straight back to the sacrum using ligatures and used the  LigaSure device.  I then dissected bluntly down the hollow of the sacrum  and relatively avascular plane.  I dissected further laterally using  primarily the LigaSure as the means of control of bleeding and division  of the tissue.  I continued the dissection downward and into the pelvis  and incised the peritoneum just anterior to the rectum in the cul-de-sac  and developed a plane of dissection immediately anterior to the rectum.  Dividing further tissues with the LigaSure anterolaterally, I mobilized  the rectum maximally and still could not definitely feel the tumor.  Dr.  Zachery Dakins then went below and performed a proctoscopy and I found that  despite extended period of dissection into the pelvis that I  simply  could not develop enough length of the rectum to place a stapling device  below the tumor.  I made  the commitment then to perform an  abdominoperineal resection.  I finished the dissection from above as far  down as I could see and then exposed perineum.  I made an elliptical  incision around the rectum to dissect down to the subcutaneous tissues  and levator musculatures fat.  I dissected up toward the coccyx and  incised the fascia and muscle right at the tip of the coccyx and entered  the pelvic cavity where dissection had taken place.  I dissected down  about 3/4 of the way around the rectum utilizing the LigaSure to divide  the muscle and tissues.  When I felt I had enough room, I passed the  proximal bowel through and pulled down on both the anorectum and the  rectosigmoid and finished the dissection anteriorly by dissecting  immediately behind the prostate gland.  After I had removed the  anorectum and rectosigmoid, I checked and saw that all of the rectum was  intact and had made no holes in it.  I got hemostasis with the cautery.  I  placed two suction drains brought out through the anterolateral  perineum and secured to the skin with 2-0 silk, placed them in the  hollow of the sacrum.  I closed the levator musculature with running 2-0  Vicryl and closed the subcutaneous tissues with running 2-0 Vicryl and  closed the perineal skin with staples.  I then went back above and  positioned the drains comfortably and thoroughly irrigated the pelvis.  I packed a couple of small bleeding areas on the sacrum with Surgicel  and then I closed the peritoneum with running 2-0 Vicryl.  It closed  nicely.  I replaced the small bowel down into the pelvis.  I checked to  make sure that I had adequate mobilization of the sigmoid colon and felt  that I did.  I chose a site for the colostomy at the lateral edge of the  rectus muscle just above the level of the umbilicus and cut out about a  3 cm piece of skin and also removed some subcutaneous tissue.  I then  made a cruciate incision in the anterior rectus sheath, divided a deal  of the rectus with the cautery and then incised the posterior sheath and  dilated it up to 3 fingerbreadths.  I pulled the proximal bowel through  taking care not to twist it and it stayed up nicely.  Nevertheless, I  put two 2-0 silk sutures in the seromuscular layers of bowel and in the  anterior abdominal wall to assure that it stayed in place.  After  obtaining correct sponge, needle and instrument counts, I closed the  fascia with running #1 PDS and then irrigated the subcutaneous tissues  and closed the skin with staples.  I then matured the colostomy with a  combination of running and interrupted 3-0 Vicryl suture.  The colostomy  lay nice and flat and had no areas of apparent dyskinesia.  I placed my  finger through nicely and there seemed to be no twisting of bowel.  I  applied a colostomy appliance.  We applied a bandage to the wound and the patient went to PACU in stable condition.      Lebron Conners, M.D.  Electronically Signed     WB/MEDQ  D:  04/27/2006  T:  04/27/2006  Job:  [id number]   cc:   Georgiana Spinner, M.D.  Fax: [phone]

## 2010-09-05 NOTE — Discharge Summary (Signed)
NAMEJOSIAH, Holden NO.:  [id number]   MEDICAL RECORD NO.:  [id number]          PATIENT TYPE:  INP   LOCATION:  1608                         FACILITY:  [facility]   PHYSICIAN:  Lebron Conners, M.D.   DATE OF BIRTH:  [birth year]   DATE OF ADMISSION:  04/27/2006  DATE OF DISCHARGE:  05/03/2006                               DISCHARGE SUMMARY   HISTORY:  The patient is a 60 year old white male who had intermittent  rectal bleeding.  Colonoscopy was normal, except for a few benign polyps  and a small rectal cancer about 8-9 cm above the anal verge.  This was  proven by biopsy.  The patient is generally healthy, having no heart and  lung problems.  He has hyperlipidemia, which is treated by Lipitor.  The  rest of his history is unremarkable.  His physical examination is  unremarkable, but he had a palpable mass at the tip of the examining  finger, digital rectal exam.  The imaging studies and lab work and  physical examination demonstrated no evidence of any metastatic disease.  The patient was brought to the hospital for resection, which he  understood might require abdominal perineal resection.   HOSPITAL COURSE:  The patient took his bowel prep at home.  On the day  of admission, he underwent abdominal exploration and mobilization of the  colon and rectum.  The mass was found to be too low to be safely  resectable by anterior resection.  He therefore underwent abdominal  perineal resection.  Blood loss was only about 150 mL, at the time of  surgery.  He had a colostomy created in the left mid-abdomen, and it  remained healthy in appearance.  He was seen by the wound and ostomy  care service for assistance with that, during the hospitalization and  after discharge.  He had a temperature of 101 a couple days after  surgery, but wound appeared to be okay, and his white count was only  12,800.  The fever resolved without antibiotic treatment.  The perineal  and abdominal  wounds continued to look fine.  By May 03, 2006 he was  afebrile, doing well.  Abdominal wound healed very nicely.  Perineal  wound had developed infection, but there seemed to be no deep space  infection.  It was opened and packed.  He was voiding without residual.  He went home with arrangements for a home health nurse to see him daily  for dressing changes.  He was to return to see me in 3 days.  Pathology  demonstrated an invasive cancer with negative surgical margins and no  tumor in 14 perirectal lymph nodes.  There was a T2, N0, M0 tumor and  was felt to have excellent prognosis.   DIAGNOSES:  1. Carcinoma of the rectum, resected.  2. Post-operative perineal wound infection.  3. Hyperlipidemia, treated by medication.   OPERATION:  Abdominal perineal resection of the rectum with colostomy,  discharge condition improved and steadily improving further.      Lebron Conners, M.D.  Electronically Signed  WB/MEDQ  D:  06/03/2006  T:  06/03/2006  Job:  [id number]

## 2010-09-05 NOTE — Op Note (Signed)
NAMEORLANDO, [name] NO.:  [id number]   MEDICAL RECORD NO.:  [id number]          PATIENT TYPE:  AMB   LOCATION:  ENDO                         FACILITY:  [facility]   PHYSICIAN:  Georgiana Spinner, M.D.    DATE OF BIRTH:  1950-11-26   DATE OF PROCEDURE:  DATE OF DISCHARGE:                               OPERATIVE REPORT   PROCEDURE:  Colonoscopy.   INDICATIONS:  Rectal bleeding.   ANESTHESIA:  Demerol 25 mg and Versed 1.5 mg.   DESCRIPTION OF PROCEDURE:  With the patient mildly sedated in the left  lateral decubitus position, a rectal examination was performed and at  the tip of my finger, I could feel a polypoid lesion.  Subsequently the  Olympus videoscopic colonoscope was inserted to the rectum, passed under  direct vision to the cecum, identified by ileocecal valve and  appendiceal orifice, both of which were photographed.  Adjacent to the  cecum was a polyp.  It was over a cm in size.  It was photographed and  it was removed using snare cautery technique, setting of 20/200 blended  current.  There was good residual base with all of the polyp removed and  this was photographed.  From this point, the colonoscope was then slowly  withdrawn, taking circumferential views of the colonic mucosa, stopping  next in the descending colon where another polyp was seen.  It, too, was  removed, using snare cautery technique after being photographed again  with the same setting.  The tissue was suctioned into the endoscope for  retrieval and placed in a separate bottle.  We next stopped at  approximately 25 cm from the anal verge, at which point a large polyp  was seen on a stalk and it was then snared at the stalk and removed  using snare cautery technique setting of 20/200 blended current.  This  was suctioned to the tip of the endoscope and withdrawn.  It was  retrieved.  The endoscope was reinserted to this level and withdrawn to  the rectum, where the previously felt mass was  noted.  It was  photographed in direct and retroflexed views.  It seemed to be  approximately 5 cm from the anal verge where hemorrhoids were also noted  on retroflexed view.  The endoscope was straightened and subsequently  biopsies were taken.  The endoscope was withdrawn; the patient's vital  signs, pulse oximeter remained stable.  The patient tolerated the  procedure well without apparent complications.   FINDINGS:  Polyp of cecum, polyp of descending colon, polyp at 25 cm  from the anal verge and mass in the rectum.   PLAN:  Await biopsy report but presume that the latter mass in the  rectum is a malignancy.  We will proceed with CT scan of the abdomen and  pelvis and have patient call me for results of biopsy and follow up with  me as an outpatient.           ______________________________  Georgiana Spinner, M.D.     GMO/MEDQ  D:  03/31/2006  T:  04/01/2006  Job:  [id number]

## 2010-09-05 NOTE — Op Note (Signed)
[name], [name] NO.:  [id number]   MEDICAL RECORD NO.:  [id number]          PATIENT TYPE:  AMB   LOCATION:  ENDO                         FACILITY:  [facility]   PHYSICIAN:  Georgiana Spinner, M.D.    DATE OF BIRTH:  1951-02-17   DATE OF PROCEDURE:  03/31/2006  DATE OF DISCHARGE:                               OPERATIVE REPORT   PROCEDURE:  Upper endoscopy.   INDICATIONS:  GERD, rule out Barrett esophagus.   ANESTHESIA:  Demerol 50, Versed 6 mg.   PROCEDURE:  With the patient mildly sedated in the left lateral  decubitus position, the Pentax videoscopic endoscope was inserted in the  mouth, passed under direct vision through the esophagus, which appeared  normal until we reached the distal esophagus.  There was a question of  Barrett esophagus, photographed and biopsies taken.  We entered into the  stomach, fundus, body, antrum, duodenal bulb, second portion of the  duodenum were visualized from this point and the scope was slowly  withdrawn, taking circumferential views of the duodenal mucosa until the  endoscope had been pulled back into the stomach, placed in retroflexion,  viewed the stomach from below.  The endoscope was straightened and  withdrawn, taking circumferential views of the remaining gastric and  esophageal mucosa.  The patient's vital signs and pulse oximeter  remained stable.  The patient tolerated the procedure well without  apparent complications.   FINDINGS:  Question of Barrett esophagus, biopsied.  Wait biopsy report.  The patient will call me for results and follow up with me as an  outpatient.  Proceed to colonoscopy is planned.           ______________________________  Georgiana Spinner, M.D.     GMO/MEDQ  D:  03/31/2006  T:  04/01/2006  Job:  [id number]

## 2010-11-24 ENCOUNTER — Encounter: Payer: Self-pay | Admitting: Gastroenterology

## 2010-12-29 ENCOUNTER — Ambulatory Visit: Payer: Self-pay | Admitting: Gastroenterology

## 2011-01-13 LAB — CBC
HCT: 43.1
Platelets: 239
RBC: 4.88
WBC: 7.5

## 2011-01-26 ENCOUNTER — Ambulatory Visit (INDEPENDENT_AMBULATORY_CARE_PROVIDER_SITE_OTHER): Payer: BC Managed Care – PPO | Admitting: Gastroenterology

## 2011-01-26 ENCOUNTER — Encounter: Payer: Self-pay | Admitting: Gastroenterology

## 2011-01-26 DIAGNOSIS — R1310 Dysphagia, unspecified: Secondary | ICD-10-CM

## 2011-01-26 DIAGNOSIS — T17308A Unspecified foreign body in larynx causing other injury, initial encounter: Secondary | ICD-10-CM

## 2011-01-26 DIAGNOSIS — C20 Malignant neoplasm of rectum: Secondary | ICD-10-CM

## 2011-01-26 DIAGNOSIS — IMO0002 Reserved for concepts with insufficient information to code with codable children: Secondary | ICD-10-CM

## 2011-01-26 NOTE — Progress Notes (Signed)
  HPI: This is a very pleasant 60 year old man  60 year old man who was diagnosed with rectal adenocarcinoma in 2007 by Dr. Sabino Gasser.  I performed an endoscopic ultrasound and staged it as a T2 N0 lesion. He went for surgical resection without neoadjuvant chemoradiation and was found on pathology to also have a T2 N0 lesion with 14 negative nodes out of 14. Surgery was in January 2008. He underwent colonoscopy March 2009 by Dr. Virginia Rochester and 2 small tubular adenomas were removed. He underwent another colonoscopy April 2010 by Dr. Virginia Rochester and one small adenoma was removed.  He has a perirectal lymph node biopsied by interventional radiology April 2009 and it was shown to have fibrosis only.  Chokes, coughs dramatically. This will happen with 4-5 times in a week.  He does not actually have dysphagia. He has pyrosis, takes PPI periodically with good effect. The pyrosis is often related to types of food.  He calls it strangling.  Maybe meats cause it more, but liquids can as.  He had an EGD with Dr. Virginia Rochester in 2007, his esophagus was dilated, per the patient. This was done for the same sensation he is having now and he tells me that he had complete relief of his symptoms for many years.  Overall weight is stable.  No overt GI bleeding.  Colostomy functions well.     Review of systems: Pertinent positive and negative review of systems were noted in the above HPI section.  All other review of systems was otherwise negative.   Past Medical History  Diagnosis Date  . Rectal cancer   . Colon polyp   . Barrett's esophagus     Past Surgical History  Procedure Date  . Rectal surgery      reports that he has never smoked. He has never used smokeless tobacco. He reports that he drinks alcohol. He reports that he does not use illicit drugs.  family history includes Hyperlipidemia in his father and Ovarian cancer in his mother.    Current Medications, Allergies were all reviewed with the patient via [facility] electronic medical record system.    Physical Exam: BP 132/88  Pulse 62  Ht 5\' 11"  (1.803 m)  Wt 250 lb (113.399 kg)  BMI 34.87 kg/m2  SpO2 98% Constitutional: generally well-appearing Psychiatric: alert and oriented x3 Eyes: extraocular movements intact Mouth: oral pharynx moist, no lesions Neck: supple no lymphadenopathy Cardiovascular: heart regular rate and rhythm Lungs: clear to auscultation bilaterally Abdomen: soft, nontender, nondistended, no obvious ascites, no peritoneal signs, normal bowel sounds Extremities: no lower extremity edema bilaterally Skin: no lesions on visible extremities    Assessment and plan: 60 y.o. male with personal history of colorectal cancer, intermittent choking sensation, previous esophageal dilation that helped his symptom  He really doesn't have dysphagia but this choking sensation was present in 2007 and he underwent EGD with dilation and had complete relief of his symptoms. I reviewed EGD by Dr. Virginia Rochester from 2007 he noted that there was a question of Barrett's esophagus changes, he did not really document any strictures but he passed Savary dilators empirically.  We will put him in our reminder system for a repeat colonoscopy in April 2013 which would be 3 years from his last one.  We will also proceed with EGD and dilation at his soonest convenience. I recommended he start taking his proton pump inhibitor on a daily basis instead of just when necessary.

## 2011-01-26 NOTE — Patient Instructions (Addendum)
Recall colonoscopy for history of rectal cancer April 2013. You should change the way you are taking your antiacid medicine (nexium) so that you are taking it 20-30 minutes prior to a decent meal as that is the way the pill is designed to work most effectively. You will be set up for an upper endoscopy with dilation.  We will get records from Dr. Virginia Rochester about your EGD, dilation. A copy of this information will be made available to Dr. Ricki Miller.

## 2011-02-27 ENCOUNTER — Telehealth: Payer: Self-pay | Admitting: Gastroenterology

## 2011-02-27 ENCOUNTER — Encounter: Payer: Self-pay | Admitting: Gastroenterology

## 2011-02-27 ENCOUNTER — Ambulatory Visit (AMBULATORY_SURGERY_CENTER): Payer: BC Managed Care – PPO | Admitting: Gastroenterology

## 2011-02-27 VITALS — Temp 98.4°F | Ht 71.0 in | Wt 250.0 lb

## 2011-02-27 DIAGNOSIS — C20 Malignant neoplasm of rectum: Secondary | ICD-10-CM

## 2011-02-27 DIAGNOSIS — K2970 Gastritis, unspecified, without bleeding: Secondary | ICD-10-CM

## 2011-02-27 DIAGNOSIS — T17308A Unspecified foreign body in larynx causing other injury, initial encounter: Secondary | ICD-10-CM

## 2011-02-27 DIAGNOSIS — K2940 Chronic atrophic gastritis without bleeding: Secondary | ICD-10-CM

## 2011-02-27 DIAGNOSIS — R1310 Dysphagia, unspecified: Secondary | ICD-10-CM

## 2011-02-27 MED ORDER — SODIUM CHLORIDE 0.9 % IV SOLN: 500.0000 mL | INTRAVENOUS | Status: DC

## 2011-02-27 NOTE — Telephone Encounter (Signed)
Pt had EGD today and now c/o dry cough.  He was instructed to take cough medicine.  He denies pain or fever.

## 2011-02-27 NOTE — Patient Instructions (Signed)
YOU HAD AN ENDOSCOPIC PROCEDURE TODAY: Refer to the procedure report that was given to you for any specific questions about what was found during the examination.  If the procedure report does not answer your questions, please call your gastroenterologist to clarify.  YOU SHOULD EXPECT: Some feelings of bloating in the abdomen. Passage of more gas than usual.  Walking can help get rid of the air that was put into your GI tract during the procedure and reduce the bloating. If you had a lower endoscopy (such as a colonoscopy or flexible sigmoidoscopy) you may notice spotting of blood in your stool or on the toilet paper.   DIET: Your first meal following the procedure should be a light meal and then it is ok to progress to your normal diet.  A half-sandwich or bowl of soup is an example of a good first meal.  Heavy or fried foods are harder to digest and may make you feel nasueas or bloated.  Drink plenty of fluids but you should avoid alcoholic beverages for 24 hours.  ACTIVITY: Your care partner should take you home directly after the procedure.  You should plan to take it easy, moving slowly for the rest of the day.  You can resume normal activity the day after the procedure however you should NOT DRIVE or use heavy machinery for 24 hours (because of the sedation medicines used during the test).  SYMPTOMS TO REPORT IMMEDIATELY: A gastroenterologist can be reached at any hour.  During normal business hours, 8:30 AM to 5:30 PM Monday through Friday, call [phone].  After hours and on weekends, please call the GI answering service at [phone] who will take a message and have the physician on call contact you.   Following lower endoscopy (colonoscopy or flexible sigmoidoscopy):  Excessive amounts of blood in the stool  Significant tenderness or worsening of abdominal pains  Swelling of the abdomen that is new, acute  Fever of 100F or higher  Following upper endoscopy (EGD)  Vomiting of  blood or coffee ground material  New chest pain or pain under the shoulder blades  Painful or persistently difficult swallowing  New shortness of breath  Fever of 100F or higher  Black, tarry-looking stools  FOLLOW UP: If any biopsies were taken you will be contacted by phone or by letter within the next 1-3 weeks.  Call your gastroenterologist if you have not heard about the biopsies in 3 weeks.  Our staff will call the home number listed on your records the next business day following your procedure to check on you and address any questions or concerns that you may have at that time regarding the information given to you following your procedure. This is a courtesy call and so if there is no answer at the home number and we have not heard from you through the emergency physician on call, we will assume that you have returned to your regular daily activities without incident.

## 2011-03-02 ENCOUNTER — Telehealth: Payer: Self-pay | Admitting: *Deleted

## 2011-03-02 NOTE — Telephone Encounter (Signed)
No answer. Left message to call us as needed.

## 2011-03-11 ENCOUNTER — Telehealth: Payer: Self-pay | Admitting: Gastroenterology

## 2011-03-11 NOTE — Telephone Encounter (Signed)
Discussed results with pts wife and notified them they would be getting a letter in the mail.

## 2011-04-21 HISTORY — PX: COLONOSCOPY: SHX174

## 2011-06-29 ENCOUNTER — Encounter: Payer: Self-pay | Admitting: Gastroenterology

## 2011-07-02 ENCOUNTER — Encounter: Payer: Self-pay | Admitting: Gastroenterology

## 2011-08-14 ENCOUNTER — Ambulatory Visit (AMBULATORY_SURGERY_CENTER): Payer: BC Managed Care – PPO | Admitting: *Deleted

## 2011-08-14 VITALS — Ht 70.5 in | Wt 252.7 lb

## 2011-08-14 DIAGNOSIS — Z85038 Personal history of other malignant neoplasm of large intestine: Secondary | ICD-10-CM

## 2011-08-14 DIAGNOSIS — Z1211 Encounter for screening for malignant neoplasm of colon: Secondary | ICD-10-CM

## 2011-08-14 MED ORDER — PEG-KCL-NACL-NASULF-NA ASC-C 100 G PO SOLR: ORAL | Status: DC

## 2011-08-17 ENCOUNTER — Encounter: Payer: Self-pay | Admitting: Gastroenterology

## 2011-08-28 ENCOUNTER — Ambulatory Visit (AMBULATORY_SURGERY_CENTER): Payer: BC Managed Care – PPO | Admitting: Gastroenterology

## 2011-08-28 ENCOUNTER — Encounter: Payer: Self-pay | Admitting: Gastroenterology

## 2011-08-28 VITALS — BP 129/70 | HR 55 | Temp 98.3°F | Resp 20 | Ht 70.5 in | Wt 252.0 lb

## 2011-08-28 DIAGNOSIS — D126 Benign neoplasm of colon, unspecified: Secondary | ICD-10-CM

## 2011-08-28 DIAGNOSIS — Z1211 Encounter for screening for malignant neoplasm of colon: Secondary | ICD-10-CM

## 2011-08-28 DIAGNOSIS — Z85038 Personal history of other malignant neoplasm of large intestine: Secondary | ICD-10-CM

## 2011-08-28 HISTORY — PX: POLYPECTOMY: SHX149

## 2011-08-28 MED ORDER — SODIUM CHLORIDE 0.9 % IV SOLN: 500.0000 mL | INTRAVENOUS | Status: DC

## 2011-08-28 NOTE — Op Note (Signed)
[facility] [address] [place], [place]  [address]  COLONOSCOPY PROCEDURE REPORT  PATIENT:  [name], [name]  MR#:  [id number] BIRTHDATE:  [birth year], 61 yrs. old  GENDER:  male ENDOSCOPIST:  Rachael Fee, MD PROCEDURE DATE:  08/28/2011 PROCEDURE:  Colon through ostomy, Colonoscopy with snare polypectomy ASA CLASS:  Class II INDICATIONS:  s/p APR for low lying rectal adenocarcinoma in 2008, repeat colonoscopy 2010 (Dr. Virginia Rochester) found 2 small polyps. MEDICATIONS:   Fentanyl 50 mcg IV, These medications were titrated to patient response per physician's verbal order, Versed 3 mg IV  DESCRIPTION OF PROCEDURE:   After the risks benefits and alternatives of the procedure were thoroughly explained, informed consent was obtained.  The LB CF-[id number] endoscope was introduced through the ostomy and advanced to the cecum, which was identified by both the appendix and ileocecal valve, without limitations.  The quality of the prep was good..  The instrument was then slowly withdrawn as the colon was fully examined. <<PROCEDUREIMAGES>> FINDINGS:   Three small sessile polyps were found and removed with cold snare. All were retrieved and all sent to pathology (jar 1). These ranged in size from 2-61mm across, located in ascending, transverse and descending segments (see image3 and image4).  This was otherwise a normal examination of the colon (see image1 and image2). COMPLICATIONS:  None  ENDOSCOPIC IMPRESSION: 1) Three small polyps, all were removed and all were sent to pathology 2) Otherwise normal examination (of entire colon from ostomy to cecum, s/p APR in 2008)  RECOMMENDATIONS: 1) If the polyp(s) removed today are proven to be adenomatous (pre-cancerous) polyps, you will need a repeat colonoscopy in 3-5 years.  ______________________________ Rachael Fee, MD  cc: Juline Patch, MD  n. eSIGNED:   Rachael Fee at 08/28/2011 01:54 PM  Esau Grew,  [phone]

## 2011-08-28 NOTE — Progress Notes (Signed)
The pt is wearing a bag.  He has a new bag in his clothing bag if needed.  Maw

## 2011-08-28 NOTE — Progress Notes (Addendum)
Pt's colonoscopy done through his stoma.  No in and out time per Dr. Christella Hartigan

## 2011-08-28 NOTE — Progress Notes (Signed)
Patient did not experience any of the following events: a burn prior to discharge; a fall within the facility; wrong site/side/patient/procedure/implant event; or a hospital transfer or hospital admission upon discharge from the facility. (G8907) Patient did not have preoperative order for IV antibiotic SSI prophylaxis. (G8918)  

## 2011-08-28 NOTE — Patient Instructions (Signed)
Discharge instructions given with verbal understanding. Handout on polyps given. Resume previous medications. YOU HAD AN ENDOSCOPIC PROCEDURE TODAY AT THE [facility]: Refer to the procedure report that was given to you for any specific questions about what was found during the examination.  If the procedure report does not answer your questions, please call your gastroenterologist to clarify.  If you requested that your care partner not be given the details of your procedure findings, then the procedure report has been included in a sealed envelope for you to review at your convenience later.  YOU SHOULD EXPECT: Some feelings of bloating in the abdomen. Passage of more gas than usual.  Walking can help get rid of the air that was put into your GI tract during the procedure and reduce the bloating. If you had a lower endoscopy (such as a colonoscopy or flexible sigmoidoscopy) you may notice spotting of blood in your stool or on the toilet paper. If you underwent a bowel prep for your procedure, then you may not have a normal bowel movement for a few days.  DIET: Your first meal following the procedure should be a light meal and then it is ok to progress to your normal diet.  A half-sandwich or bowl of soup is an example of a good first meal.  Heavy or fried foods are harder to digest and may make you feel nauseous or bloated.  Likewise meals heavy in dairy and vegetables can cause extra gas to form and this can also increase the bloating.  Drink plenty of fluids but you should avoid alcoholic beverages for 24 hours.  ACTIVITY: Your care partner should take you home directly after the procedure.  You should plan to take it easy, moving slowly for the rest of the day.  You can resume normal activity the day after the procedure however you should NOT DRIVE or use heavy machinery for 24 hours (because of the sedation medicines used during the test).    SYMPTOMS TO REPORT IMMEDIATELY: A  gastroenterologist can be reached at any hour.  During normal business hours, 8:30 AM to 5:00 PM Monday through Friday, call [phone].  After hours and on weekends, please call the GI answering service at [phone] who will take a message and have the physician on call contact you.   Following lower endoscopy (colonoscopy or flexible sigmoidoscopy):  Excessive amounts of blood in the stool  Significant tenderness or worsening of abdominal pains  Swelling of the abdomen that is new, acute  Fever of 100F or higher  FOLLOW UP: If any biopsies were taken you will be contacted by phone or by letter within the next 1-3 weeks.  Call your gastroenterologist if you have not heard about the biopsies in 3 weeks.  Our staff will call the home number listed on your records the next business day following your procedure to check on you and address any questions or concerns that you may have at that time regarding the information given to you following your procedure. This is a courtesy call and so if there is no answer at the home number and we have not heard from you through the emergency physician on call, we will assume that you have returned to your regular daily activities without incident.  SIGNATURES/CONFIDENTIALITY: You and/or your care partner have signed paperwork which will be entered into your electronic medical record.  These signatures attest to the fact that that the information above on your After Visit Summary has   been reviewed and is understood.  Full responsibility of the confidentiality of this discharge information lies with you and/or your care-partner. 

## 2011-08-31 ENCOUNTER — Telehealth: Payer: Self-pay

## 2011-08-31 NOTE — Telephone Encounter (Signed)
  Follow up Call-  Call back number 08/28/2011 02/27/2011  Post procedure Call Back phone  # [phone] hm speak with Scarlette Calico his wife [phone]  Permission to leave phone message Yes -     Patient questions:  Do you have a fever, pain , or abdominal swelling? no Pain Score  0 *  Have you tolerated food without any problems? yes  Have you been able to return to your normal activities? yes  Do you have any questions about your discharge instructions: Diet   no Medications  no Follow up visit  no  Do you have questions or concerns about your Care? no  Actions: * If pain score is 4 or above: No action needed, pain <4.

## 2011-09-04 ENCOUNTER — Encounter: Payer: Self-pay | Admitting: Gastroenterology

## 2012-12-22 ENCOUNTER — Telehealth: Payer: Self-pay | Admitting: Gastroenterology

## 2012-12-22 NOTE — Telephone Encounter (Signed)
Pt requested a copy of the EUS mailed to her home for insurance purposes

## 2013-05-15 ENCOUNTER — Telehealth: Payer: Self-pay | Admitting: Gastroenterology

## 2013-05-16 NOTE — Telephone Encounter (Signed)
Left message on machine to call back  

## 2013-05-16 NOTE — Telephone Encounter (Signed)
Pt states that he was getting a CT every year when seeing Dr Lajoyce Corners, but hasn't had one in some time.  When should he have another scheduled if at all?  Please advise

## 2013-05-17 NOTE — Telephone Encounter (Signed)
Can you let him know I am asking medical oncology if/when he needs follow up imaging. Will get in touch with him when I hear back.

## 2013-05-17 NOTE — Telephone Encounter (Signed)
Pt wife has been notified

## 2013-05-19 ENCOUNTER — Telehealth: Payer: Self-pay

## 2013-05-19 NOTE — Telephone Encounter (Signed)
Message copied by Barron Alvine on Fri May 19, 2013  8:13 AM ------      Message from: Owens Loffler P      Created: Thu May 18, 2013  7:38 AM       Kyndell Zeiser,      Can you call him back.  Let him know I spoke with oncologist and he does not need CT scans for follow up.  If he has any concerns or questions about this recommendation, please set him up with ROV.  Thanks                                    ----- Message -----         From: Carola Frost, RN         Sent: 05/17/2013  11:39 AM           To: Milus Banister, MD            Hi Dr. Ardis Hughs,            I showed this to Dr. Benay Spice and his answer is "no".            Thanks,      Barnett Applebaum      ----- Message -----         From: Milus Banister, MD         Sent: 05/17/2013   9:33 AM           To: Carola Frost, RN            Barnett Applebaum,      Can you pass this question by Dr. Benay Spice:            63 year old man who was diagnosed with rectal adenocarcinoma in 2007 by Dr. Jim Desanctis.  I performed an endoscopic ultrasound and staged it as a T2 N0 lesion. He went for surgical resection without neoadjuvant chemoradiation and was found on pathology to also have a T2 N0 lesion with 14 negative nodes out of 14. Surgery was in January 2008. Currently up to date on surveillance, screening colonoscopies.            The patient asked me by phone note recently if he needs follow up CT scans "was getting them once per year with Dr. Lajoyce Corners." I don't know correct answer.              Thanks                   ------

## 2013-05-19 NOTE — Telephone Encounter (Signed)
Left message on machine to call back  

## 2013-05-19 NOTE — Telephone Encounter (Signed)
Informed patient's wife and they both do not have any questions or concerns.

## 2014-08-24 DIAGNOSIS — Z86010 Personal history of colonic polyps: Secondary | ICD-10-CM | POA: Insufficient documentation

## 2014-09-07 ENCOUNTER — Encounter: Payer: Self-pay | Admitting: Gastroenterology

## 2014-09-25 ENCOUNTER — Encounter: Payer: Self-pay | Admitting: Gastroenterology

## 2014-11-23 ENCOUNTER — Ambulatory Visit (AMBULATORY_SURGERY_CENTER): Payer: Self-pay

## 2014-11-23 VITALS — Ht 70.0 in | Wt 249.0 lb

## 2014-11-23 DIAGNOSIS — Z86010 Personal history of colonic polyps: Secondary | ICD-10-CM

## 2014-11-23 NOTE — Progress Notes (Signed)
No egg or soy allergy. No previous complications from anesthesia.  No home O2.  No diet meds.  Pt will bring supplies for ostomy change if needed

## 2014-11-26 ENCOUNTER — Encounter: Payer: Self-pay | Admitting: Gastroenterology

## 2014-12-07 ENCOUNTER — Ambulatory Visit (AMBULATORY_SURGERY_CENTER): Payer: BLUE CROSS/BLUE SHIELD | Admitting: Gastroenterology

## 2014-12-07 ENCOUNTER — Encounter: Payer: Self-pay | Admitting: Gastroenterology

## 2014-12-07 VITALS — BP 121/77 | HR 53 | Temp 97.7°F | Resp 14 | Ht 70.0 in | Wt 249.0 lb

## 2014-12-07 DIAGNOSIS — Z85048 Personal history of other malignant neoplasm of rectum, rectosigmoid junction, and anus: Secondary | ICD-10-CM

## 2014-12-07 DIAGNOSIS — D122 Benign neoplasm of ascending colon: Secondary | ICD-10-CM | POA: Diagnosis not present

## 2014-12-07 DIAGNOSIS — Z86010 Personal history of colonic polyps: Secondary | ICD-10-CM | POA: Diagnosis present

## 2014-12-07 MED ORDER — SODIUM CHLORIDE 0.9 % IV SOLN: 500.0000 mL | INTRAVENOUS | Status: DC

## 2014-12-07 NOTE — Patient Instructions (Signed)
Discharge instructions given. Handout on polyps. Resume previous medications. YOU HAD AN ENDOSCOPIC PROCEDURE TODAY AT THE [facility]:   Refer to the procedure report that was given to you for any specific questions about what was found during the examination.  If the procedure report does not answer your questions, please call your gastroenterologist to clarify.  If you requested that your care partner not be given the details of your procedure findings, then the procedure report has been included in a sealed envelope for you to review at your convenience later.  YOU SHOULD EXPECT: Some feelings of bloating in the abdomen. Passage of more gas than usual.  Walking can help get rid of the air that was put into your GI tract during the procedure and reduce the bloating. If you had a lower endoscopy (such as a colonoscopy or flexible sigmoidoscopy) you may notice spotting of blood in your stool or on the toilet paper. If you underwent a bowel prep for your procedure, you may not have a normal bowel movement for a few days.  Please Note:  You might notice some irritation and congestion in your nose or some drainage.  This is from the oxygen used during your procedure.  There is no need for concern and it should clear up in a day or so.  SYMPTOMS TO REPORT IMMEDIATELY:   Following lower endoscopy (colonoscopy or flexible sigmoidoscopy):  Excessive amounts of blood in the stool  Significant tenderness or worsening of abdominal pains  Swelling of the abdomen that is new, acute  Fever of 100F or higher   For urgent or emergent issues, a gastroenterologist can be reached at any hour by calling [phone].   DIET: Your first meal following the procedure should be a small meal and then it is ok to progress to your normal diet. Heavy or fried foods are harder to digest and may make you feel nauseous or bloated.  Likewise, meals heavy in dairy and vegetables can increase bloating.  Drink  plenty of fluids but you should avoid alcoholic beverages for 24 hours.  ACTIVITY:  You should plan to take it easy for the rest of today and you should NOT DRIVE or use heavy machinery until tomorrow (because of the sedation medicines used during the test).    FOLLOW UP: Our staff will call the number listed on your records the next business day following your procedure to check on you and address any questions or concerns that you may have regarding the information given to you following your procedure. If we do not reach you, we will leave a message.  However, if you are feeling well and you are not experiencing any problems, there is no need to return our call.  We will assume that you have returned to your regular daily activities without incident.  If any biopsies were taken you will be contacted by phone or by letter within the next 1-3 weeks.  Please call us at [phone] if you have not heard about the biopsies in 3 weeks.    SIGNATURES/CONFIDENTIALITY: You and/or your care partner have signed paperwork which will be entered into your electronic medical record.  These signatures attest to the fact that that the information above on your After Visit Summary has been reviewed and is understood.  Full responsibility of the confidentiality of this discharge information lies with you and/or your care-partner. 

## 2014-12-07 NOTE — Op Note (Signed)
[facility]  [address] [place], [address]   COLONOSCOPY PROCEDURE REPORT  PATIENT: [name], [name]  MR#: [id number] BIRTHDATE: Sep 13, 1950 , 64  yrs. old GENDER: male ENDOSCOPIST: Milus Banister, MD PROCEDURE DATE:  12/07/2014 PROCEDURE:   Colonoscopy (via ostomy), surveillance , Colonoscopy (via ostomy)  with snare polypectomy, and Colonoscopy (via ostomy) with biopsy First Screening Colonoscopy - Avg.  risk and is 50 yrs.  old or older - No.  Prior Negative Screening - Now for repeat screening. N/A  History of Adenoma - Now for follow-up colonoscopy & has been > or = to 3 yrs.  Yes hx of adenoma.  Has been 3 or more years since last colonoscopy.  Polyps removed today? Yes ASA CLASS:   Class II INDICATIONS:s/p APR for low lying rectal adenocarcinoma in 2008, colonoscopy Dr. Lajoyce Corners 2009 found small polyp, colonoscopy Dr. Ardis Hughs 2013 found 3 small adenomas. MEDICATIONS: Monitored anesthesia care and Propofol 170 mg IV  DESCRIPTION OF PROCEDURE:   After the risks benefits and alternatives of the procedure were thoroughly explained, informed consent was obtained.  The digital rectal exam revealed no abnormalities of the rectum.   The [id number]  endoscope was introduced through the ostomyand advanced to the cecum, which was identified by both the appendix and ileocecal valve. No adverse events experienced.   The quality of the prep was excellent.  The instrument was then slowly withdrawn as the colon was fully examined. Estimated blood loss is zero unless otherwise noted in this procedure report.   COLON FINDINGS: Two sessile polyps ranging between 3-59mm in size were found in the ascending colon.  Polypectomies were performed with cold forceps and with a cold snare.  The resection was complete, the polyp tissue was completely retrieved and sent to histology.   The examination was otherwise normal.  not performed (end colostomy). The time to cecum =     Withdrawal time =      The scope was withdrawn and the procedure completed. COMPLICATIONS: There were no immediate complications.  ENDOSCOPIC IMPRESSION: 1.   Two sessile polyps ranging between 3-50mm in size were found in the ascending colon; polypectomies were performed with cold forceps and with a cold snare 2.   The examination was otherwise normal (via ostomy)  RECOMMENDATIONS: If the polyp(s) removed today are proven to be adenomatous (pre-cancerous) polyps, you will need a repeat colonoscopy in 5 years.   You will receive a letter within 1-2 weeks with the results of your biopsy as well as final recommendations.  Please call my office if you have not received a letter after 3 weeks.  eSigned:  Milus Banister, MD 12/07/2014 1:49 PM

## 2014-12-07 NOTE — Progress Notes (Signed)
Called to room to assist during endoscopic procedure.  Patient ID and intended procedure confirmed with present staff. Received instructions for my participation in the procedure from the performing physician.  

## 2014-12-07 NOTE — Progress Notes (Signed)
To recovery, report to McCoy, RN, VSS 

## 2014-12-10 ENCOUNTER — Telehealth: Payer: Self-pay | Admitting: *Deleted

## 2014-12-10 NOTE — Telephone Encounter (Signed)
  Follow up Call-  Call back number 12/07/2014  Post procedure Call Back phone  # [phone]  Permission to leave phone message Yes     Patient questions:  Do you have a fever, pain , or abdominal swelling? No. Pain Score  0 *  Have you tolerated food without any problems? Yes.    Have you been able to return to your normal activities? Yes.    Do you have any questions about your discharge instructions: Diet   No. Medications  No. Follow up visit  No.  Do you have questions or concerns about your Care? No.  Actions: * If pain score is 4 or above: No action needed, pain <4.  I did fine no problems"

## 2014-12-11 ENCOUNTER — Encounter: Payer: Self-pay | Admitting: Gastroenterology

## 2014-12-17 ENCOUNTER — Telehealth: Payer: Self-pay | Admitting: Gastroenterology

## 2014-12-17 NOTE — Telephone Encounter (Signed)
Pathology letter reviewed with pt. 

## 2015-06-28 ENCOUNTER — Telehealth: Payer: Self-pay | Admitting: Gastroenterology

## 2015-06-28 NOTE — Telephone Encounter (Signed)
Pts wife states that pt has been having changes in his stool alternating between liquid stools and hard stools. Pt has history of colon cancer and is anxious and needs to be seen. Pt scheduled to see Lori Hvozdovic, PA-C 07/05/15@8 :45am. Pt aware of appt.

## 2015-07-05 ENCOUNTER — Ambulatory Visit: Payer: BLUE CROSS/BLUE SHIELD | Admitting: Physician Assistant

## 2015-07-15 ENCOUNTER — Telehealth: Payer: Self-pay | Admitting: Gastroenterology

## 2015-07-15 NOTE — Telephone Encounter (Signed)
The pt was notified to call the surgeons office that placed the colostomy.  She agreed and will call them and ask for a new script

## 2015-10-31 DIAGNOSIS — Z933 Colostomy status: Secondary | ICD-10-CM | POA: Diagnosis not present

## 2015-11-05 DIAGNOSIS — H524 Presbyopia: Secondary | ICD-10-CM | POA: Diagnosis not present

## 2015-11-12 DIAGNOSIS — H25813 Combined forms of age-related cataract, bilateral: Secondary | ICD-10-CM | POA: Diagnosis not present

## 2015-12-02 DIAGNOSIS — H25811 Combined forms of age-related cataract, right eye: Secondary | ICD-10-CM | POA: Diagnosis not present

## 2015-12-02 DIAGNOSIS — H2511 Age-related nuclear cataract, right eye: Secondary | ICD-10-CM | POA: Diagnosis not present

## 2015-12-13 DIAGNOSIS — H2511 Age-related nuclear cataract, right eye: Secondary | ICD-10-CM | POA: Diagnosis not present

## 2016-01-22 DIAGNOSIS — Z933 Colostomy status: Secondary | ICD-10-CM | POA: Diagnosis not present

## 2016-01-29 DIAGNOSIS — H2512 Age-related nuclear cataract, left eye: Secondary | ICD-10-CM | POA: Diagnosis not present

## 2016-01-29 DIAGNOSIS — H2511 Age-related nuclear cataract, right eye: Secondary | ICD-10-CM | POA: Diagnosis not present

## 2016-01-29 DIAGNOSIS — H25812 Combined forms of age-related cataract, left eye: Secondary | ICD-10-CM | POA: Diagnosis not present

## 2016-02-26 DIAGNOSIS — H25013 Cortical age-related cataract, bilateral: Secondary | ICD-10-CM | POA: Diagnosis not present

## 2016-02-28 DIAGNOSIS — R040 Epistaxis: Secondary | ICD-10-CM | POA: Diagnosis not present

## 2016-02-28 DIAGNOSIS — J069 Acute upper respiratory infection, unspecified: Secondary | ICD-10-CM | POA: Diagnosis not present

## 2016-03-03 DIAGNOSIS — R7301 Impaired fasting glucose: Secondary | ICD-10-CM | POA: Diagnosis not present

## 2016-03-03 DIAGNOSIS — E782 Mixed hyperlipidemia: Secondary | ICD-10-CM | POA: Diagnosis not present

## 2016-03-03 DIAGNOSIS — Z0000 Encounter for general adult medical examination without abnormal findings: Secondary | ICD-10-CM | POA: Diagnosis not present

## 2016-03-16 DIAGNOSIS — Z23 Encounter for immunization: Secondary | ICD-10-CM | POA: Diagnosis not present

## 2016-03-16 DIAGNOSIS — R05 Cough: Secondary | ICD-10-CM | POA: Diagnosis not present

## 2016-03-16 DIAGNOSIS — R7301 Impaired fasting glucose: Secondary | ICD-10-CM | POA: Diagnosis not present

## 2016-03-16 DIAGNOSIS — E782 Mixed hyperlipidemia: Secondary | ICD-10-CM | POA: Diagnosis not present

## 2016-03-16 DIAGNOSIS — C189 Malignant neoplasm of colon, unspecified: Secondary | ICD-10-CM | POA: Diagnosis not present

## 2016-03-20 ENCOUNTER — Ambulatory Visit: Payer: BLUE CROSS/BLUE SHIELD | Admitting: Physician Assistant

## 2016-05-03 ENCOUNTER — Emergency Department (HOSPITAL_COMMUNITY)
Admission: EM | Admit: 2016-05-03 | Discharge: 2016-05-03 | Disposition: A | Discharge status: Home / Self Care | Payer: PPO | Attending: Emergency Medicine | Admitting: Emergency Medicine

## 2016-05-03 ENCOUNTER — Encounter (HOSPITAL_COMMUNITY): Payer: Self-pay | Admitting: Emergency Medicine

## 2016-05-03 DIAGNOSIS — Z87891 Personal history of nicotine dependence: Secondary | ICD-10-CM | POA: Insufficient documentation

## 2016-05-03 DIAGNOSIS — Z85048 Personal history of other malignant neoplasm of rectum, rectosigmoid junction, and anus: Secondary | ICD-10-CM | POA: Insufficient documentation

## 2016-05-03 DIAGNOSIS — Z79899 Other long term (current) drug therapy: Secondary | ICD-10-CM | POA: Diagnosis not present

## 2016-05-03 DIAGNOSIS — R55 Syncope and collapse: Secondary | ICD-10-CM | POA: Diagnosis not present

## 2016-05-03 DIAGNOSIS — K529 Noninfective gastroenteritis and colitis, unspecified: Secondary | ICD-10-CM | POA: Diagnosis not present

## 2016-05-03 LAB — URINALYSIS, ROUTINE W REFLEX MICROSCOPIC
Glucose, UA: NEGATIVE mg/dL
Hgb urine dipstick: NEGATIVE
KETONES UR: 5 mg/dL — AB
Leukocytes, UA: NEGATIVE
NITRITE: NEGATIVE
PH: 5 (ref 5.0–8.0)
PROTEIN: 100 mg/dL — AB
Specific Gravity, Urine: 1.028 (ref 1.005–1.030)

## 2016-05-03 LAB — CBC
HEMATOCRIT: 48.6 % (ref 39.0–52.0)
Hemoglobin: 17.2 g/dL — ABNORMAL HIGH (ref 13.0–17.0)
MCH: 31 pg (ref 26.0–34.0)
MCHC: 35.4 g/dL (ref 30.0–36.0)
MCV: 87.6 fL (ref 78.0–100.0)
PLATELETS: 247 10*3/uL (ref 150–400)
RBC: 5.55 MIL/uL (ref 4.22–5.81)
RDW: 13.2 % (ref 11.5–15.5)
WBC: 17.8 10*3/uL — AB (ref 4.0–10.5)

## 2016-05-03 LAB — BASIC METABOLIC PANEL
Anion gap: 11 (ref 5–15)
BUN: 25 mg/dL — AB (ref 6–20)
CALCIUM: 9 mg/dL (ref 8.9–10.3)
CO2: 19 mmol/L — ABNORMAL LOW (ref 22–32)
CREATININE: 1.57 mg/dL — AB (ref 0.61–1.24)
Chloride: 108 mmol/L (ref 101–111)
GFR calc Af Amer: 51 mL/min — ABNORMAL LOW (ref >60)
GFR, EST NON AFRICAN AMERICAN: 44 mL/min — AB (ref >60)
GLUCOSE: 198 mg/dL — AB (ref 65–99)
Potassium: 4.5 mmol/L (ref 3.5–5.1)
SODIUM: 138 mmol/L (ref 135–145)

## 2016-05-03 LAB — CBG MONITORING, ED: GLUCOSE-CAPILLARY: 183 mg/dL — AB (ref 65–99)

## 2016-05-03 MED ORDER — ONDANSETRON HCL 4 MG/2ML IJ SOLN
4.0000 mg | Freq: Once | INTRAMUSCULAR | Status: AC
  Administered 2016-05-03: 4 mg via INTRAVENOUS
  Filled 2016-05-03: qty 2

## 2016-05-03 MED ORDER — DIPHENOXYLATE-ATROPINE 2.5-0.025 MG PO TABS: 1.0000 | ORAL_TABLET | Freq: Four times a day (QID) | ORAL | 0 refills | Status: DC | PRN

## 2016-05-03 MED ORDER — SODIUM CHLORIDE 0.9 % IV BOLUS (SEPSIS)
1000.0000 mL | Freq: Once | INTRAVENOUS | Status: AC
  Administered 2016-05-03: 1000 mL via INTRAVENOUS

## 2016-05-03 MED ORDER — ONDANSETRON 4 MG PO TBDP: 4.0000 mg | ORAL_TABLET | Freq: Three times a day (TID) | ORAL | 0 refills | Status: DC | PRN

## 2016-05-03 NOTE — Discharge Instructions (Signed)
Clear liquids.  Slowly increase diet as symptoms improve.  Zofran for nausea, lomotil for diarrhea.

## 2016-05-03 NOTE — ED Triage Notes (Signed)
Pt woke up at 0130 today with feeling of nausea and had diarrhea through colostomy bag, got into bathtub, felt weak, laid down on sofa, then vomited shortly after, went to bathroom third time, wife heard pt fall and found pt on floor not breathing or responding, wife "pounded" on pt's chest until he woke up, at which time wife noted pulse to be "very irregular." Pt does not remember syncopal episode.

## 2016-05-03 NOTE — ED Provider Notes (Signed)
[facility] DEPT Provider Note   CSN: [id number] Arrival date & time: 05/03/16  [id number]     History   Chief Complaint Chief Complaint  Patient presents with  . Loss of Consciousness    HPI [name] is a 66 y.o. male. Patient presents for evaluation after an episode of syncope.  Patient has a distant history of low-grade colon cancer requiring colostomy. Over 9 years ago. Normal state of health until today. He states that he awakened late last night with some abdominal cramping and nausea. States he does not vomit often and has not vomited since his colostomy. He felt a sudden abdominal pressure went to the bathroom and had large output through his ostomy and vomiting. This recurred several more times during the night.  He had another episode early this morning where he went into the bathroom to urinate he murmurs feeling some nausea. He had a syncopal episode and ended up draped across the edge of the bathtub into the bathtub and was still having nausea and vomiting and dry heaving. His wife arrived and pushed on his back and "had to stimulate him". Wife states that he was "unconscious" however she states that he was constantly vomiting. He states he was sent but not able to answer. He gathered himself after his emesis resolved. He is able to sit on the edge of the toilet where he felt dizzy for a few more minutes but this improved. Now he states he feels "fine" and is "dying of thirst".  He reports previous episodes of syncope in his life with blood draws. He states the site of blood he has passed out several times. He passed out during a dental procedure with an injection of the needle a few years ago as well. Describes fairly classic vasovagal episodes  HPI  Past Medical History:  Diagnosis Date  . Arthritis   . Colon polyp   . Rectal cancer [facility])     Patient Active Problem List   Diagnosis Date Noted  . History of colonic polyps 08/24/2014  . Rectal cancer ([place]) 01/26/2011     Past Surgical History:  Procedure Laterality Date  . COLONOSCOPY  2013  . POLYPECTOMY  08-28-11   3 polyps  . RECTAL SURGERY     with colostomy       Home Medications    Prior to Admission medications   Medication Sig Start Date End Date Taking? Authorizing Provider  meclizine (ANTIVERT) 25 MG tablet Take 25 mg by mouth 3 (three) times daily as needed for dizziness.   Yes Historical Provider, MD  Multiple Vitamin (MULTIVITAMIN) tablet Take 1 tablet by mouth daily.   Yes Historical Provider, MD  Probiotic Product (PROBIOTIC DAILY PO) Take 1 capsule by mouth daily.   Yes Historical Provider, MD  diphenoxylate-atropine (LOMOTIL) 2.5-0.025 MG tablet Take 1 tablet by mouth 4 (four) times daily as needed for diarrhea or loose stools. 05/03/16   Tanna Furry, MD  diphenoxylate-atropine (LOMOTIL) 2.5-0.025 MG tablet Take 1 tablet by mouth 4 (four) times daily as needed for diarrhea or loose stools. 05/03/16   Tanna Furry, MD  IBUPROFEN PO Take by mouth as needed.    Historical Provider, MD  omeprazole (PRILOSEC) 40 MG capsule  08/07/11   Historical Provider, MD  ondansetron (ZOFRAN ODT) 4 MG disintegrating tablet Take 1 tablet (4 mg total) by mouth every 8 (eight) hours as needed for nausea. 05/03/16   Tanna Furry, MD  ondansetron (ZOFRAN ODT) 4 MG disintegrating tablet Take  1 tablet (4 mg total) by mouth every 8 (eight) hours as needed for nausea. 05/03/16   Tanna Furry, MD    Family History Family History  Problem Relation Age of Onset  . Ovarian cancer Mother   . Breast cancer Mother   . Hyperlipidemia Father     mother  . Stomach cancer Neg Hx   . Colon cancer Neg Hx   . Esophageal cancer Neg Hx   . Prostate cancer Neg Hx   . Rectal cancer Neg Hx     Social History Social History  Substance Use Topics  . Smoking status: Former Research scientist (life sciences)  . Smokeless tobacco: Never Used  . Alcohol use 0.6 oz/week    1 Standard drinks or equivalent per week     Comment: socially     Allergies     Patient has no known allergies.   Review of Systems Review of Systems  Constitutional: Negative for appetite change, chills, diaphoresis, fatigue and fever.  HENT: Negative for mouth sores, sore throat and trouble swallowing.   Eyes: Negative for visual disturbance.  Respiratory: Negative for cough, chest tightness, shortness of breath and wheezing.   Cardiovascular: Negative for chest pain.  Gastrointestinal: Positive for diarrhea, nausea and vomiting. Negative for abdominal distention and abdominal pain.  Endocrine: Negative for polydipsia, polyphagia and polyuria.  Genitourinary: Negative for dysuria, frequency and hematuria.  Musculoskeletal: Negative for gait problem.  Skin: Negative for color change, pallor and rash.  Neurological: Positive for syncope. Negative for dizziness, light-headedness and headaches.  Hematological: Does not bruise/bleed easily.  Psychiatric/Behavioral: Negative for behavioral problems and confusion.     Physical Exam Updated Vital Signs BP 110/56   Pulse 63   Temp 99.2 F (37.3 C) (Oral)   Resp 16   Ht 5\' 11"  (1.803 m)   Wt 244 lb (110.7 kg)   SpO2 98%   BMI 34.03 kg/m   Physical Exam  Constitutional: He is oriented to person, place, and time. He appears well-developed and well-nourished. No distress.  HENT:  Head: Normocephalic.  Eyes: Conjunctivae are normal. Pupils are equal, round, and reactive to light. No scleral icterus.  Neck: Normal range of motion. Neck supple. No thyromegaly present.  Cardiovascular: Normal rate and regular rhythm.  Exam reveals no gallop and no friction rub.   No murmur heard. Pulmonary/Chest: Effort normal and breath sounds normal. No respiratory distress. He has no wheezes. He has no rales.  Abdominal: Soft. Bowel sounds are normal. He exhibits no distension. There is no tenderness. There is no rebound.    Musculoskeletal: Normal range of motion.  Neurological: He is alert and oriented to person, place,  and time.  Skin: Skin is warm and dry. No rash noted.  Psychiatric: He has a normal mood and affect. His behavior is normal.     ED Treatments / Results  Labs (all labs ordered are listed, but only abnormal results are displayed) Labs Reviewed  BASIC METABOLIC PANEL - Abnormal; Notable for the following:       Result Value   CO2 19 (*)    Glucose, Bld 198 (*)    BUN 25 (*)    Creatinine, Ser 1.57 (*)    GFR calc non Af Amer 44 (*)    GFR calc Af Amer 51 (*)    All other components within normal limits  CBC - Abnormal; Notable for the following:    WBC 17.8 (*)    Hemoglobin 17.2 (*)    All other  components within normal limits  URINALYSIS, ROUTINE W REFLEX MICROSCOPIC - Abnormal; Notable for the following:    Color, Urine AMBER (*)    APPearance CLOUDY (*)    Bilirubin Urine SMALL (*)    Ketones, ur 5 (*)    Protein, ur 100 (*)    Bacteria, UA RARE (*)    Squamous Epithelial / LPF 0-5 (*)    All other components within normal limits  CBG MONITORING, ED - Abnormal; Notable for the following:    Glucose-Capillary 183 (*)    All other components within normal limits  CBG MONITORING, ED    EKG  EKG Interpretation None       Radiology No results found.  Procedures Procedures (including critical care time)  Medications Ordered in ED Medications  ondansetron (ZOFRAN) injection 4 mg (4 mg Intravenous Given 05/03/16 0957)  sodium chloride 0.9 % bolus 1,000 mL (0 mLs Intravenous Stopped 05/03/16 1116)     Initial Impression / Assessment and Plan / ED Course  I have reviewed the triage vital signs and the nursing notes.  Pertinent labs & imaging results that were available during my care of the patient were reviewed by me and considered in my medical decision making (see chart for details).  Clinical Course     Fairly classic vasovagal episode during an episode of gastroenteritis in a patient with previous vasovagal syncope leukocytosis of 17,000 or likely  de-margination. Cranial 1.57. He does not know his baseline. He is completely asymptomatic here. Blood pressure 110/56. His given IV fluids and Zofran. He is taking by mouth he is ambulatory and thirsty and drinking well. I think is appropriate for outpatient treatment for his gastroenteritis with Zofran and Lomotil and clear liquids. Recheck with any recurrence or worsening of symptoms..   Final Clinical Impressions(s) / ED Diagnoses   Final diagnoses:  Vasovagal syncope  Gastroenteritis    New Prescriptions Discharge Medication List as of 05/03/2016 10:54 AM    START taking these medications   Details  diphenoxylate-atropine (LOMOTIL) 2.5-0.025 MG tablet Take 1 tablet by mouth 4 (four) times daily as needed for diarrhea or loose stools., Starting Sun 05/03/2016, Print    ondansetron (ZOFRAN ODT) 4 MG disintegrating tablet Take 1 tablet (4 mg total) by mouth every 8 (eight) hours as needed for nausea., Starting Sun 05/03/2016, Print         Tanna Furry, MD 05/03/16 [phone]

## 2016-07-23 DIAGNOSIS — Z933 Colostomy status: Secondary | ICD-10-CM | POA: Diagnosis not present

## 2016-07-24 DIAGNOSIS — Z933 Colostomy status: Secondary | ICD-10-CM | POA: Diagnosis not present

## 2016-09-06 DIAGNOSIS — J069 Acute upper respiratory infection, unspecified: Secondary | ICD-10-CM | POA: Diagnosis not present

## 2016-09-06 DIAGNOSIS — R509 Fever, unspecified: Secondary | ICD-10-CM | POA: Diagnosis not present

## 2016-11-25 ENCOUNTER — Ambulatory Visit (INDEPENDENT_AMBULATORY_CARE_PROVIDER_SITE_OTHER): Payer: PPO | Admitting: Gastroenterology

## 2016-11-25 ENCOUNTER — Encounter: Payer: Self-pay | Admitting: Gastroenterology

## 2016-11-25 ENCOUNTER — Encounter (INDEPENDENT_AMBULATORY_CARE_PROVIDER_SITE_OTHER): Payer: Self-pay

## 2016-11-25 VITALS — BP 130/76 | HR 60 | Ht 69.09 in | Wt 245.4 lb

## 2016-11-25 DIAGNOSIS — Z7189 Other specified counseling: Secondary | ICD-10-CM | POA: Diagnosis not present

## 2016-11-25 DIAGNOSIS — Z85048 Personal history of other malignant neoplasm of rectum, rectosigmoid junction, and anus: Secondary | ICD-10-CM | POA: Diagnosis not present

## 2016-11-25 NOTE — Patient Instructions (Signed)
Referral to [facility] for bleeding at colostomy. Try taking 1/2 imodium pill once daily to decrease the loose, liquid content of your stool.

## 2016-11-25 NOTE — Progress Notes (Signed)
Review of pertinent gastrointestinal problems: 1. APR for low lying T2N0 rectal adenocarcinoma in 2008,colonoscopy Dr. Lajoyce Corners 2009 found small polyp, colonoscopy Dr. Art Buff found 3 small adenomas.  Repeat colonoscopy Dr. Ardis Hughs 2016: two subCM adenomas, recall at 5 years.   HPI: This is a very pleasant 66 year old man whom I know from previous colonoscopies. I last saw him about 2 years ago. He is here today for a new problem  Chief complaint is bleeding in his ostomy site, liquid, solid mixed stools  He has 50% liquid, 50% solid; empties bag 3-4 times per day (usually when he urinates).  This is not much more than a bother for him. He is just not sure if it indicates something serious.  He has no abdominal pains  He has bleeding daily from around his ostomy site. He says even very light touch will cause the ostomy site bleed. This can happen when he is in the shower or when he is putting on a new bag.  He does not see blood coming from within the ostom  Overall stable weight.  Cannot lose weight.    Review of systems: Pertinent positive and negative review of systems were noted in the above HPI section. All other review negative.   Past Medical History:  Diagnosis Date  . Arthritis   . Colon polyp   . Rectal cancer [facility])     Past Surgical History:  Procedure Laterality Date  . COLONOSCOPY  2013  . POLYPECTOMY  08-28-11   3 polyps  . RECTAL SURGERY     with colostomy    Current Outpatient Prescriptions  Medication Sig Dispense Refill  . IBUPROFEN PO Take by mouth as needed.    . Multiple Vitamin (MULTIVITAMIN) tablet Take 1 tablet by mouth daily.    . Probiotic Product (PROBIOTIC DAILY PO) Take 1 capsule by mouth daily.     No current facility-administered medications for this visit.     Allergies as of 11/25/2016  . (No Known Allergies)    Family History  Problem Relation Age of Onset  . Ovarian cancer Mother   . Breast cancer Mother   . Hyperlipidemia  Father        mother  . Stomach cancer Neg Hx   . Colon cancer Neg Hx   . Esophageal cancer Neg Hx   . Prostate cancer Neg Hx   . Rectal cancer Neg Hx     Social History   Social History  . Marital status: Married    Spouse name: N/A  . Number of children: 1  . Years of education: `   Occupational History  . engineer    Social History Main Topics  . Smoking status: Former Research scientist (life sciences)  . Smokeless tobacco: Never Used  . Alcohol use 0.6 oz/week    1 Standard drinks or equivalent per week     Comment: socially  . Drug use: No  . Sexual activity: Not on file   Other Topics Concern  . Not on file   Social History Narrative   2 CUPS OF COFFEE A DAY     Physical Exam: BP 130/76 (BP Location: Left Arm, Patient Position: Sitting, Cuff Size: Normal)   Pulse 60   Ht 5' 9.09" (1.755 m) Comment: height measured without shoes  Wt 245 lb 6 oz (111.3 kg)   BMI 36.14 kg/m  Constitutional: generally well-appearing Psychiatric: alert and oriented x3 Eyes: extraocular movements intact Mouth: oral pharynx moist, no lesions Neck: supple no lymphadenopathy  Cardiovascular: heart regular rate and rhythm Lungs: clear to auscultation bilaterally Abdomen: soft, nontender, nondistended, no obvious ascites, no peritoneal signs, normal bowel sounds Extremities: no lower extremity edema bilaterally Skin: no lesions on visible extremities Left sided ostomy site inspected: The ostomy itself is pink, normal appearing. The skin, most peripheral mucosa has significant edematous granulation tissue surrounding the entire ostomy.   Assessment and plan: 66 y.o. male with  bleeding from ostomy site, intermittent loose stools  First the bleeding from his ostomy is almost certainly from the granulation tissue that has built up around the ostomy site. I'm going to refer him back to [facility] surgery for local therapy, possibly silver nitrate applications. We do not have that here. He has liquid  stool mixed with solids and liquids can be sometimes a little bothersome. I do not think this is a sign of anything significant or serious going on. Perhaps it is a result of some of the granulation tissue causing mucus. Either way I recommended that he try a half pill of Imodium on a daily basis to see if that causes more solid and less liquid component.  I do not think he needs repeat colonoscopy at this point since he had one just 2 years ago.    Please see the "Patient Instructions" section for addition details about the plan.   Owens Loffler, MD [facility] 11/25/2016, 9:45 AM  Cc: Merrilee Seashore, MD

## 2016-12-01 ENCOUNTER — Telehealth: Payer: Self-pay | Admitting: Gastroenterology

## 2016-12-01 DIAGNOSIS — L929 Granulomatous disorder of the skin and subcutaneous tissue, unspecified: Secondary | ICD-10-CM

## 2016-12-01 NOTE — Telephone Encounter (Signed)
Referral was faxed, pt aware to call if appt not scheduled by Monday please call

## 2016-12-09 DIAGNOSIS — Z433 Encounter for attention to colostomy: Secondary | ICD-10-CM | POA: Diagnosis not present

## 2016-12-23 DIAGNOSIS — Z433 Encounter for attention to colostomy: Secondary | ICD-10-CM | POA: Diagnosis not present

## 2016-12-30 ENCOUNTER — Emergency Department (HOSPITAL_COMMUNITY)
Admission: EM | Admit: 2016-12-30 | Discharge: 2016-12-30 | Disposition: A | Discharge status: Home / Self Care | Payer: PPO | Attending: Emergency Medicine | Admitting: Emergency Medicine

## 2016-12-30 ENCOUNTER — Emergency Department (HOSPITAL_COMMUNITY): Payer: PPO

## 2016-12-30 ENCOUNTER — Encounter (HOSPITAL_COMMUNITY): Payer: Self-pay | Admitting: Emergency Medicine

## 2016-12-30 DIAGNOSIS — Z79899 Other long term (current) drug therapy: Secondary | ICD-10-CM | POA: Diagnosis not present

## 2016-12-30 DIAGNOSIS — R61 Generalized hyperhidrosis: Secondary | ICD-10-CM | POA: Insufficient documentation

## 2016-12-30 DIAGNOSIS — R001 Bradycardia, unspecified: Secondary | ICD-10-CM | POA: Diagnosis not present

## 2016-12-30 DIAGNOSIS — R42 Dizziness and giddiness: Secondary | ICD-10-CM | POA: Diagnosis not present

## 2016-12-30 DIAGNOSIS — S61212A Laceration without foreign body of right middle finger without damage to nail, initial encounter: Secondary | ICD-10-CM | POA: Diagnosis not present

## 2016-12-30 DIAGNOSIS — Y929 Unspecified place or not applicable: Secondary | ICD-10-CM | POA: Diagnosis not present

## 2016-12-30 DIAGNOSIS — W312XXA Contact with powered woodworking and forming machines, initial encounter: Secondary | ICD-10-CM | POA: Insufficient documentation

## 2016-12-30 DIAGNOSIS — S62632B Displaced fracture of distal phalanx of right middle finger, initial encounter for open fracture: Secondary | ICD-10-CM | POA: Diagnosis not present

## 2016-12-30 DIAGNOSIS — S61322A Laceration with foreign body of right middle finger with damage to nail, initial encounter: Secondary | ICD-10-CM | POA: Diagnosis not present

## 2016-12-30 DIAGNOSIS — Z85048 Personal history of other malignant neoplasm of rectum, rectosigmoid junction, and anus: Secondary | ICD-10-CM | POA: Insufficient documentation

## 2016-12-30 DIAGNOSIS — M79641 Pain in right hand: Secondary | ICD-10-CM | POA: Diagnosis not present

## 2016-12-30 DIAGNOSIS — Y939 Activity, unspecified: Secondary | ICD-10-CM | POA: Insufficient documentation

## 2016-12-30 DIAGNOSIS — S61209A Unspecified open wound of unspecified finger without damage to nail, initial encounter: Secondary | ICD-10-CM | POA: Diagnosis not present

## 2016-12-30 DIAGNOSIS — I959 Hypotension, unspecified: Secondary | ICD-10-CM | POA: Diagnosis not present

## 2016-12-30 DIAGNOSIS — R9431 Abnormal electrocardiogram [ECG] [EKG]: Secondary | ICD-10-CM | POA: Diagnosis not present

## 2016-12-30 DIAGNOSIS — Y999 Unspecified external cause status: Secondary | ICD-10-CM | POA: Diagnosis not present

## 2016-12-30 DIAGNOSIS — R52 Pain, unspecified: Secondary | ICD-10-CM | POA: Diagnosis not present

## 2016-12-30 DIAGNOSIS — S61312A Laceration without foreign body of right middle finger with damage to nail, initial encounter: Secondary | ICD-10-CM | POA: Diagnosis not present

## 2016-12-30 DIAGNOSIS — S61310A Laceration without foreign body of right index finger with damage to nail, initial encounter: Secondary | ICD-10-CM | POA: Diagnosis not present

## 2016-12-30 LAB — I-STAT CHEM 8, ED
BUN: 30 mg/dL — ABNORMAL HIGH (ref 6–20)
CHLORIDE: 110 mmol/L (ref 101–111)
CREATININE: 1.1 mg/dL (ref 0.61–1.24)
Calcium, Ion: 1.14 mmol/L — ABNORMAL LOW (ref 1.15–1.40)
Glucose, Bld: 96 mg/dL (ref 65–99)
HCT: 43 % (ref 39.0–52.0)
HEMOGLOBIN: 14.6 g/dL (ref 13.0–17.0)
Potassium: 4.5 mmol/L (ref 3.5–5.1)
Sodium: 140 mmol/L (ref 135–145)
TCO2: 23 mmol/L (ref 22–32)

## 2016-12-30 LAB — CBC
HEMATOCRIT: 42.3 % (ref 39.0–52.0)
Hemoglobin: 14.7 g/dL (ref 13.0–17.0)
MCH: 31.1 pg (ref 26.0–34.0)
MCHC: 34.8 g/dL (ref 30.0–36.0)
MCV: 89.4 fL (ref 78.0–100.0)
Platelets: 205 10*3/uL (ref 150–400)
RBC: 4.73 MIL/uL (ref 4.22–5.81)
RDW: 13.1 % (ref 11.5–15.5)
WBC: 9.9 10*3/uL (ref 4.0–10.5)

## 2016-12-30 MED ORDER — CEPHALEXIN 500 MG PO CAPS: 500.0000 mg | ORAL_CAPSULE | Freq: Four times a day (QID) | ORAL | 0 refills | Status: DC

## 2016-12-30 MED ORDER — CEFAZOLIN SODIUM-DEXTROSE 2-4 GM/100ML-% IV SOLN
2.0000 g | Freq: Once | INTRAVENOUS | Status: AC
Start: 2016-12-30 — End: 2016-12-30
  Administered 2016-12-30: 2 g via INTRAVENOUS
  Filled 2016-12-30: qty 100

## 2016-12-30 MED ORDER — BUPIVACAINE HCL (PF) 0.5 % IJ SOLN
5.0000 mL | Freq: Once | INTRAMUSCULAR | Status: AC
  Administered 2016-12-30: 5 mL
  Filled 2016-12-30: qty 30

## 2016-12-30 MED ORDER — HYDROCODONE-ACETAMINOPHEN 5-325 MG PO TABS: 1.0000 | ORAL_TABLET | Freq: Four times a day (QID) | ORAL | 0 refills | Status: DC | PRN

## 2016-12-30 NOTE — ED Triage Notes (Signed)
To ED via [facility] -- from urgent care in [place]-- pt cut right middle finger on radial arm saw-- pt was hypotensive and bradycardic at urgent care-- sent here. Pt alert/oriented x 4 on arrival-- denies chest pain, does have pain in finger.

## 2016-12-30 NOTE — ED Provider Notes (Signed)
[facility] DEPT Provider Note   CSN: [id number] Arrival date & time: 12/30/16  1622     History   Chief Complaint Chief Complaint  Patient presents with  . Irregular Heart Beat  . finger laceration    HPI [name] is a 66 y.o. male.  HPI Patient presents from an urgent care. Has radial arm saw injury to his right middle finger. However while he was in urgent care he went hypotensive and bradycardic. Sent in here. No chest pain. Has had a history of vasovagal syncope. States there was a fair amount of blood loss at the scene but he did wrap it up and rinse well. Last tetanus was around 5 years ago. No chest pain. No trouble breathing. No numbness in the finger. No other injury. Past Medical History:  Diagnosis Date  . Arthritis   . Colon polyp   . Rectal cancer [facility])     Patient Active Problem List   Diagnosis Date Noted  . History of colonic polyps 08/24/2014  . Rectal cancer ([place]) 01/26/2011    Past Surgical History:  Procedure Laterality Date  . COLONOSCOPY  2013  . POLYPECTOMY  08-28-11   3 polyps  . RECTAL SURGERY     with colostomy       Home Medications    Prior to Admission medications   Medication Sig Start Date End Date Taking? Authorizing Provider  IBUPROFEN PO Take by mouth as needed.    [provider]  Multiple Vitamin (MULTIVITAMIN) tablet Take 1 tablet by mouth daily.    [provider]  Probiotic Product (PROBIOTIC DAILY PO) Take 1 capsule by mouth daily.    [provider]    Family History Family History  Problem Relation Age of Onset  . Ovarian cancer Mother   . Breast cancer Mother   . Hyperlipidemia Father        mother  . Stomach cancer Neg Hx   . Colon cancer Neg Hx   . Esophageal cancer Neg Hx   . Prostate cancer Neg Hx   . Rectal cancer Neg Hx     Social History Social History  Substance Use Topics  . Smoking status: Former Research scientist (life sciences)  . Smokeless tobacco: Never Used  . Alcohol use 0.6  oz/week    1 Standard drinks or equivalent per week     Comment: socially     Allergies   Patient has no known allergies.   Review of Systems Review of Systems  Constitutional: Positive for diaphoresis. Negative for appetite change and fever.  HENT: Negative for congestion.   Respiratory: Negative for stridor.   Cardiovascular: Negative for chest pain.  Genitourinary: Negative for frequency.  Musculoskeletal: Negative for joint swelling.  Skin: Positive for wound.  Neurological: Positive for light-headedness. Negative for weakness.     Physical Exam Updated Vital Signs BP 134/83 (BP Location: Right Arm)   Pulse 60   Temp 97.9 F (36.6 C) (Oral)   Resp 14   Ht 5\' 10"  (1.778 m)   Wt 112 kg (247 lb)   SpO2 97%   BMI 35.44 kg/m   Physical Exam  Constitutional: He appears well-developed.  HENT:  Head: Atraumatic.  Eyes: Pupils are equal, round, and reactive to light.  Neck: Neck supple.  Cardiovascular: Normal rate.   Pulmonary/Chest: Effort normal.  Musculoskeletal:  Wound over the distal phalanx of the right finger. It is on both the palmar and dorsal aspect. An avulsed the nail and the  nail bed but does not appear to progress all the way to the nail fold. Good flexion and extension at the DIP PIP and MCP joint. Good capillary refill distally.  Neurological: He is alert.  Skin: Skin is warm. Capillary refill takes less than 2 seconds.             ED Treatments / Results  Labs (all labs ordered are listed, but only abnormal results are displayed) Labs Reviewed - No data to display  EKG  EKG Interpretation  Date/Time:  Wednesday December 30 2016 16:36:31 EDT Ventricular Rate:  68 PR Interval:    QRS Duration: 83 QT Interval:  417 QTC Calculation: 444 R Axis:   1 Text Interpretation:  Sinus arrhythmia Borderline prolonged PR interval Low voltage, precordial leads Probable anteroseptal infarct, old Confirmed by Davonna Belling [phone]) on 12/30/2016  4:49:13 PM       Radiology No results found.  Procedures Procedures (including critical care time)  Medications Ordered in ED Medications  bupivacaine (MARCAINE) 0.5 % injection 5 mL (not administered)     Initial Impression / Assessment and Plan / ED Course  I have reviewed the triage vital signs and the nursing notes.  Pertinent labs & imaging results that were available during my care of the patient were reviewed by me and considered in my medical decision making (see chart for details).     Patient with finger laceration into the bone. Also nailbed. Numbed with Marcaine by me in a digital block. Irrigated. Seen in the ER by Dr. Lenon Curt, who closed the wound in the ER and will follow-up as an outpatient. Given Ancef here and Keflex for home. Did have a near syncopal episode while at urgent care. Likely vagal. Blood pressure improved. Has had some mild bradycardia here in the 60s and even 50s. Has been asymptomatic with that here however. Will need follow-up as an outpatient but I think the syncope itself is likely more of a vagal event.  Final Clinical Impressions(s) / ED Diagnoses   Final diagnoses:  None    New Prescriptions New Prescriptions   No medications on file     Davonna Belling, MD 12/30/16 2040

## 2016-12-30 NOTE — ED Notes (Signed)
Finger laceration through nailbed, bleeding controlled.

## 2016-12-30 NOTE — Consult Note (Signed)
Reason for Consult:fionger injury Referring Physician: ER  CC:I cut my; finger  HPI:  [name] is an 66 y.o. right handed male who presents with saw injury to right long finger from saw today.  Pt was using a saw and finger accidentally entered the blade.       .   Pain is rated at   8 /10 and is described as sharp.  Pain is constant.  Pain is made better by rest/immobilization, worse with motion.   Associated signs/symptoms: bleeding, missing tissue,  Previous treatment:    Past Medical History:  Diagnosis Date  . Arthritis   . Colon polyp   . Rectal cancer [facility] [address])     Past Surgical History:  Procedure Laterality Date  . COLONOSCOPY  2013  . POLYPECTOMY  08-28-11   3 polyps  . RECTAL SURGERY     with colostomy    Family History  Problem Relation Age of Onset  . Ovarian cancer Mother   . Breast cancer Mother   . Hyperlipidemia Father        mother  . Stomach cancer Neg Hx   . Colon cancer Neg Hx   . Esophageal cancer Neg Hx   . Prostate cancer Neg Hx   . Rectal cancer Neg Hx     Social History:  reports that he has quit smoking. He has never used smokeless tobacco. He reports that he drinks about 0.6 oz of alcohol per week . He reports that he does not use drugs.  Allergies: No Known Allergies  Medications: I have reviewed the patient's current medications.  Results for orders placed or performed during the hospital encounter of 12/30/16 (from the past 48 hour(s))  CBC     Status: None   Collection Time: 12/30/16  6:24 PM  Result Value Ref Range   WBC 9.9 4.0 - 10.5 K/uL   RBC 4.73 4.22 - 5.81 MIL/uL   Hemoglobin 14.7 13.0 - 17.0 g/dL   HCT 42.3 39.0 - 52.0 %   MCV 89.4 78.0 - 100.0 fL   MCH 31.1 26.0 - 34.0 pg   MCHC 34.8 30.0 - 36.0 g/dL   RDW 13.1 11.5 - 15.5 %   Platelets 205 150 - 400 K/uL  I-stat Chem 8, ED     Status: Abnormal   Collection Time: 12/30/16  6:43 PM  Result Value Ref Range   Sodium 140 135 - 145 mmol/L   Potassium 4.5 3.5 - 5.1 mmol/L    Chloride 110 101 - 111 mmol/L   BUN 30 (H) 6 - 20 mg/dL   Creatinine, Ser 1.10 0.61 - 1.24 mg/dL   Glucose, Bld 96 65 - 99 mg/dL   Calcium, Ion 1.14 (L) 1.15 - 1.40 mmol/L   TCO2 23 22 - 32 mmol/L   Hemoglobin 14.6 13.0 - 17.0 g/dL   HCT 43.0 39.0 - 52.0 %    Dg Finger Middle Right  Result Date: 12/30/2016 CLINICAL DATA:  Table saw injury, laceration to right middle finger EXAM: RIGHT MIDDLE FINGER 2+V COMPARISON:  None. FINDINGS: There is a soft tissue and bony defect noted at the tip of the right middle finger and distal phalanx. No subluxation or dislocation. IMPRESSION: Soft tissue defect an bone defect in the distal phalanx of the right middle finger. Electronically Signed   By: Rolm Baptise M.D.   On: 12/30/2016 18:02    Pertinent items are noted in HPI. Temp:  [97.9 F (36.6 C)] 97.9 F (36.6  C) (09/12 1639) Pulse Rate:  [57-65] 57 (09/12 1938) Resp:  [12-18] 18 (09/12 1938) BP: (119-134)/(63-83) 119/63 (09/12 1938) SpO2:  [95 %-98 %] 97 % (09/12 1938) Weight:  [112 kg (247 lb)] 112 kg (247 lb) (09/12 1639) General appearance: alert and cooperative Resp: clear to auscultation bilaterally Cardio: regular rate and rhythm GI: soft, non-tender; bowel sounds normal; no masses,  no organomegaly Extremities: extremities normal, atraumatic, no cyanosis or edema Except for right long finger with complex laceration, nail bed injury extends dorsally and volarly thru radial side of base of nail  Assessment: Open fracture, laceration, nail bed injury Plan: Needs wash out and repair I have discussed this treatment plan in detail with patient and family, including the risks of the recommended treatment or surgery, the benefits and the alternatives.  The patient  understands that additional treatment may be necessary.  Procedure: finger was already anesthetized by ER.  Finger / hand prepped with betadine, draped sterilly.  Wound explored (bone, volar tendon sheath, nail bed) irrigated  thoroughly with saline.  Nail bed repaired with 6-0 chromic, laceration repaired with 5-0 nylon, sterile dressing applied.  Pt tolerated procedure well.  Instructions given.  Dennie Bible 12/30/2016, 7:41 PM

## 2017-01-06 DIAGNOSIS — Z933 Colostomy status: Secondary | ICD-10-CM | POA: Diagnosis not present

## 2017-01-12 DIAGNOSIS — Z933 Colostomy status: Secondary | ICD-10-CM | POA: Diagnosis not present

## 2017-01-22 DIAGNOSIS — Z433 Encounter for attention to colostomy: Secondary | ICD-10-CM | POA: Diagnosis not present

## 2017-02-24 DIAGNOSIS — Z23 Encounter for immunization: Secondary | ICD-10-CM | POA: Diagnosis not present

## 2017-02-26 DIAGNOSIS — M654 Radial styloid tenosynovitis [de Quervain]: Secondary | ICD-10-CM | POA: Diagnosis not present

## 2017-02-26 DIAGNOSIS — M720 Palmar fascial fibromatosis [Dupuytren]: Secondary | ICD-10-CM | POA: Diagnosis not present

## 2017-03-10 DIAGNOSIS — C189 Malignant neoplasm of colon, unspecified: Secondary | ICD-10-CM | POA: Diagnosis not present

## 2017-03-10 DIAGNOSIS — E782 Mixed hyperlipidemia: Secondary | ICD-10-CM | POA: Diagnosis not present

## 2017-03-10 DIAGNOSIS — R7301 Impaired fasting glucose: Secondary | ICD-10-CM | POA: Diagnosis not present

## 2017-03-16 DIAGNOSIS — Z933 Colostomy status: Secondary | ICD-10-CM | POA: Diagnosis not present

## 2017-03-24 DIAGNOSIS — Z433 Encounter for attention to colostomy: Secondary | ICD-10-CM | POA: Diagnosis not present

## 2017-04-19 DIAGNOSIS — E782 Mixed hyperlipidemia: Secondary | ICD-10-CM | POA: Diagnosis not present

## 2017-04-19 DIAGNOSIS — C189 Malignant neoplasm of colon, unspecified: Secondary | ICD-10-CM | POA: Diagnosis not present

## 2017-04-19 DIAGNOSIS — R7301 Impaired fasting glucose: Secondary | ICD-10-CM | POA: Diagnosis not present

## 2017-04-19 DIAGNOSIS — Z0000 Encounter for general adult medical examination without abnormal findings: Secondary | ICD-10-CM | POA: Diagnosis not present

## 2017-05-28 DIAGNOSIS — H524 Presbyopia: Secondary | ICD-10-CM | POA: Diagnosis not present

## 2017-06-21 DIAGNOSIS — Z933 Colostomy status: Secondary | ICD-10-CM | POA: Diagnosis not present

## 2017-06-28 DIAGNOSIS — Z933 Colostomy status: Secondary | ICD-10-CM | POA: Diagnosis not present

## 2017-07-21 DIAGNOSIS — Z433 Encounter for attention to colostomy: Secondary | ICD-10-CM | POA: Diagnosis not present

## 2017-09-22 DIAGNOSIS — Z433 Encounter for attention to colostomy: Secondary | ICD-10-CM | POA: Diagnosis not present

## 2017-09-29 DIAGNOSIS — Z933 Colostomy status: Secondary | ICD-10-CM | POA: Diagnosis not present

## 2017-10-04 DIAGNOSIS — R7301 Impaired fasting glucose: Secondary | ICD-10-CM | POA: Diagnosis not present

## 2017-10-04 DIAGNOSIS — C189 Malignant neoplasm of colon, unspecified: Secondary | ICD-10-CM | POA: Diagnosis not present

## 2017-10-04 DIAGNOSIS — E782 Mixed hyperlipidemia: Secondary | ICD-10-CM | POA: Diagnosis not present

## 2017-10-11 DIAGNOSIS — R7301 Impaired fasting glucose: Secondary | ICD-10-CM | POA: Diagnosis not present

## 2017-10-11 DIAGNOSIS — N400 Enlarged prostate without lower urinary tract symptoms: Secondary | ICD-10-CM | POA: Diagnosis not present

## 2017-10-11 DIAGNOSIS — H6982 Other specified disorders of Eustachian tube, left ear: Secondary | ICD-10-CM | POA: Diagnosis not present

## 2017-10-11 DIAGNOSIS — E782 Mixed hyperlipidemia: Secondary | ICD-10-CM | POA: Diagnosis not present

## 2017-10-19 DIAGNOSIS — N390 Urinary tract infection, site not specified: Secondary | ICD-10-CM | POA: Diagnosis not present

## 2017-10-19 DIAGNOSIS — R300 Dysuria: Secondary | ICD-10-CM | POA: Diagnosis not present

## 2017-11-26 DIAGNOSIS — N390 Urinary tract infection, site not specified: Secondary | ICD-10-CM | POA: Diagnosis not present

## 2017-11-26 DIAGNOSIS — R509 Fever, unspecified: Secondary | ICD-10-CM | POA: Diagnosis not present

## 2017-11-26 DIAGNOSIS — R5383 Other fatigue: Secondary | ICD-10-CM | POA: Diagnosis not present

## 2017-11-26 DIAGNOSIS — R350 Frequency of micturition: Secondary | ICD-10-CM | POA: Diagnosis not present

## 2017-11-26 DIAGNOSIS — R05 Cough: Secondary | ICD-10-CM | POA: Diagnosis not present

## 2017-12-31 DIAGNOSIS — Z933 Colostomy status: Secondary | ICD-10-CM | POA: Diagnosis not present

## 2018-01-11 DIAGNOSIS — R7301 Impaired fasting glucose: Secondary | ICD-10-CM | POA: Diagnosis not present

## 2018-01-11 DIAGNOSIS — E782 Mixed hyperlipidemia: Secondary | ICD-10-CM | POA: Diagnosis not present

## 2018-01-11 DIAGNOSIS — N400 Enlarged prostate without lower urinary tract symptoms: Secondary | ICD-10-CM | POA: Diagnosis not present

## 2018-01-19 DIAGNOSIS — R05 Cough: Secondary | ICD-10-CM | POA: Diagnosis not present

## 2018-01-19 DIAGNOSIS — E782 Mixed hyperlipidemia: Secondary | ICD-10-CM | POA: Diagnosis not present

## 2018-01-19 DIAGNOSIS — Z23 Encounter for immunization: Secondary | ICD-10-CM | POA: Diagnosis not present

## 2018-01-19 DIAGNOSIS — C189 Malignant neoplasm of colon, unspecified: Secondary | ICD-10-CM | POA: Diagnosis not present

## 2018-01-19 DIAGNOSIS — N400 Enlarged prostate without lower urinary tract symptoms: Secondary | ICD-10-CM | POA: Diagnosis not present

## 2018-01-19 DIAGNOSIS — R7301 Impaired fasting glucose: Secondary | ICD-10-CM | POA: Diagnosis not present

## 2018-01-31 DIAGNOSIS — K219 Gastro-esophageal reflux disease without esophagitis: Secondary | ICD-10-CM | POA: Insufficient documentation

## 2018-01-31 DIAGNOSIS — R490 Dysphonia: Secondary | ICD-10-CM | POA: Diagnosis not present

## 2018-01-31 DIAGNOSIS — R05 Cough: Secondary | ICD-10-CM | POA: Diagnosis not present

## 2018-03-08 DIAGNOSIS — J0100 Acute maxillary sinusitis, unspecified: Secondary | ICD-10-CM | POA: Diagnosis not present

## 2018-03-08 DIAGNOSIS — E782 Mixed hyperlipidemia: Secondary | ICD-10-CM | POA: Diagnosis not present

## 2018-03-08 DIAGNOSIS — R05 Cough: Secondary | ICD-10-CM | POA: Diagnosis not present

## 2018-03-28 DIAGNOSIS — J45909 Unspecified asthma, uncomplicated: Secondary | ICD-10-CM | POA: Diagnosis not present

## 2018-03-28 DIAGNOSIS — R05 Cough: Secondary | ICD-10-CM | POA: Diagnosis not present

## 2018-04-01 DIAGNOSIS — J45909 Unspecified asthma, uncomplicated: Secondary | ICD-10-CM | POA: Diagnosis not present

## 2018-04-05 DIAGNOSIS — Z933 Colostomy status: Secondary | ICD-10-CM | POA: Diagnosis not present

## 2018-04-20 DIAGNOSIS — Z933 Colostomy status: Secondary | ICD-10-CM | POA: Diagnosis not present

## 2018-06-02 DIAGNOSIS — L570 Actinic keratosis: Secondary | ICD-10-CM | POA: Diagnosis not present

## 2018-06-02 DIAGNOSIS — X32XXXA Exposure to sunlight, initial encounter: Secondary | ICD-10-CM | POA: Diagnosis not present

## 2018-06-10 DIAGNOSIS — E782 Mixed hyperlipidemia: Secondary | ICD-10-CM | POA: Diagnosis not present

## 2018-06-10 DIAGNOSIS — C189 Malignant neoplasm of colon, unspecified: Secondary | ICD-10-CM | POA: Diagnosis not present

## 2018-06-10 DIAGNOSIS — N400 Enlarged prostate without lower urinary tract symptoms: Secondary | ICD-10-CM | POA: Diagnosis not present

## 2018-06-10 DIAGNOSIS — R7301 Impaired fasting glucose: Secondary | ICD-10-CM | POA: Diagnosis not present

## 2018-07-04 DIAGNOSIS — C189 Malignant neoplasm of colon, unspecified: Secondary | ICD-10-CM | POA: Diagnosis not present

## 2018-07-04 DIAGNOSIS — Z23 Encounter for immunization: Secondary | ICD-10-CM | POA: Diagnosis not present

## 2018-07-04 DIAGNOSIS — R7301 Impaired fasting glucose: Secondary | ICD-10-CM | POA: Diagnosis not present

## 2018-07-04 DIAGNOSIS — Z0000 Encounter for general adult medical examination without abnormal findings: Secondary | ICD-10-CM | POA: Diagnosis not present

## 2018-07-04 DIAGNOSIS — N400 Enlarged prostate without lower urinary tract symptoms: Secondary | ICD-10-CM | POA: Diagnosis not present

## 2018-07-04 DIAGNOSIS — E782 Mixed hyperlipidemia: Secondary | ICD-10-CM | POA: Diagnosis not present

## 2018-08-11 DIAGNOSIS — Z933 Colostomy status: Secondary | ICD-10-CM | POA: Diagnosis not present

## 2018-08-22 IMAGING — DX DG FINGER MIDDLE 2+V*R*
3 series · 3 of 3 positions shown · non-contrast
Comparison: None.

CLINICAL DATA: Table saw injury, laceration to right middle finger

EXAM:
RIGHT MIDDLE FINGER 2+V

[x finger pa right]
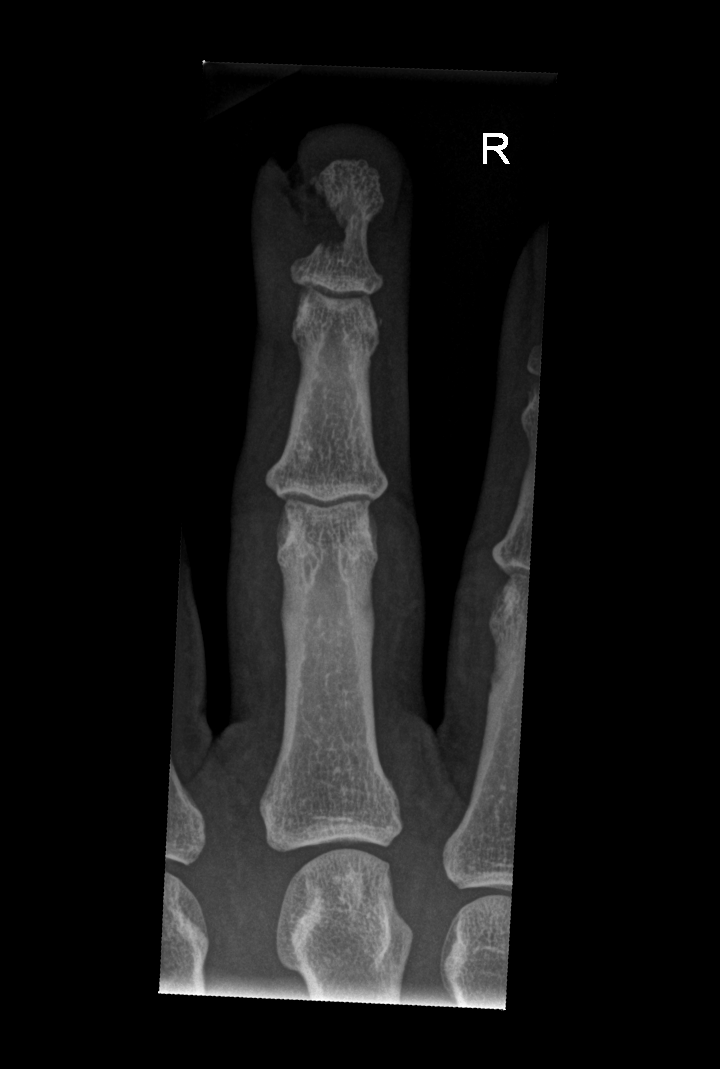

[x finger obl right]
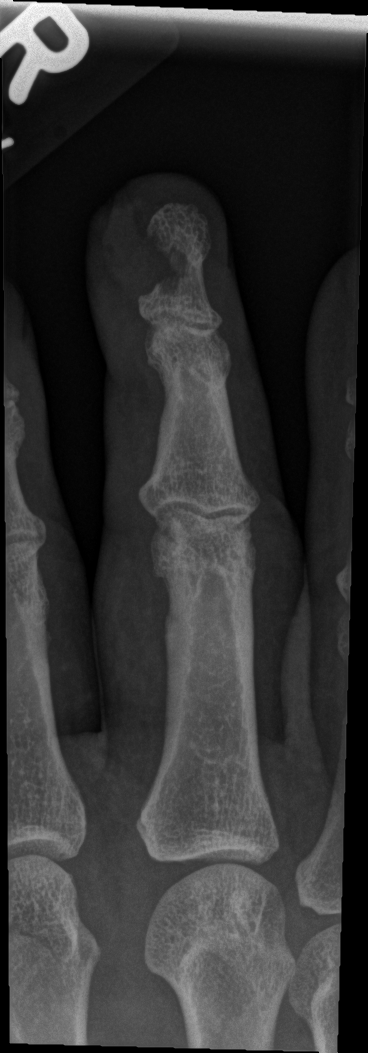

[x finger lat right]
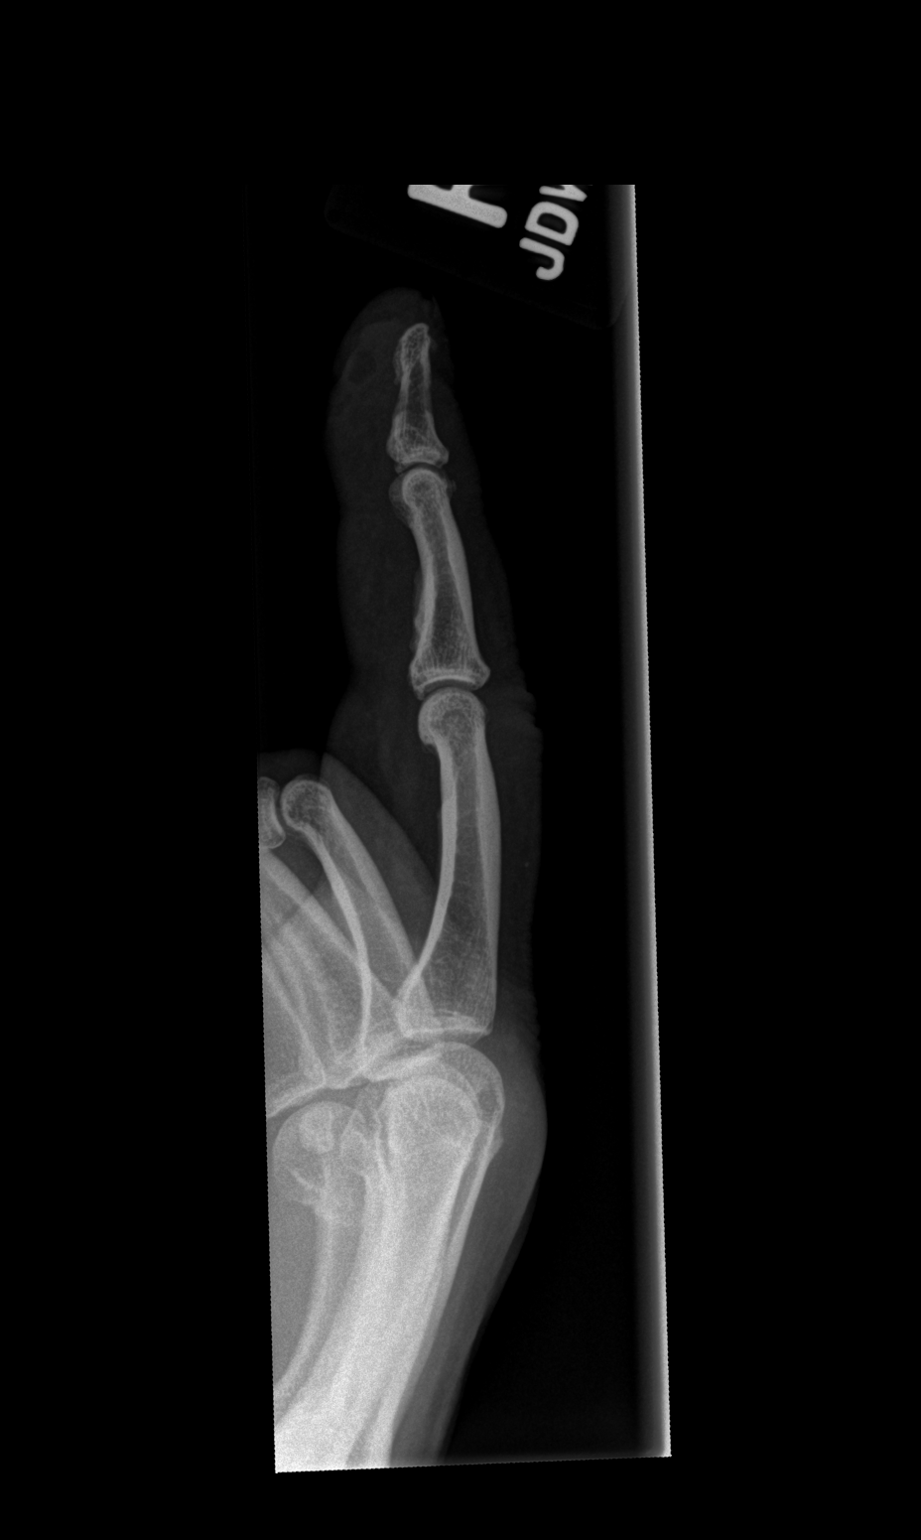

[3 of 3 positions shown; findings below may reference images not displayed]

FINDINGS: There is a soft tissue and bony defect noted at the tip of the right
middle finger and distal phalanx. No subluxation or dislocation.
IMPRESSION: Soft tissue defect an bone defect in the distal phalanx of the right
middle finger.

## 2018-11-09 DIAGNOSIS — Z433 Encounter for attention to colostomy: Secondary | ICD-10-CM | POA: Diagnosis not present

## 2018-11-16 DIAGNOSIS — Z933 Colostomy status: Secondary | ICD-10-CM | POA: Diagnosis not present

## 2018-11-23 ENCOUNTER — Ambulatory Visit: Payer: Self-pay | Admitting: Surgery

## 2018-11-23 NOTE — H&P (Signed)
CC: Peristomal skin - abnormal appearance  HPI: [name] is a very pleasant 27yoM with hx of rectal cancer whom underwent exlap/APR 04/2006 with Dr. Neil Holden. He reports that he received no chemotherapy or radiation any point during his treatment course. He recovered well from everything. He has had surveillance endoscopy - last colonoscopy with with Dr. Ardis Holden in 2016, due to repeat next year. Just 16 he had 2 small polyp removed but the exam is otherwise normal. 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 He has noticed the inferior aspect of his colostomy has become more irritated and prone to bleeding. He has cut his appointment such that all of the skin beneath his colostomy is exposed. He has been followed by Dr. Kae Holden who noted granulation tissue initially at this location and has proliferated sense. He was subsequently sent to me for further evaluation. He reports his weight is stable. He denies any other complaints. He denies any nausea/following/abdominal pain. He denies any blood tinged or bloody stool. He reports his colostomy is working well without issues. He denies any complaints regarding his perineum.  He was followed with surveillance imaging through 2011 and had no evidence of recurrence.  PMH: Rectal cancer s/p APR 2008.  PSH: Exlap/APR 2008. Denies any other abodminal operations.  FHx: Denies FHx of malignancy  Social: Denies use of tobacco/EtOH/drugs. 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  ROS: A comprehensive 10 system review of systems was completed with the patient and pertinent findings as noted above.  The patient is a 68 year old male.   Allergies [name], Holden; 11/16/2018 3:00 PM) No Known Drug Allergies  [12/09/2016]: Allergies Reconciled   Medication History [name],  Holden; 11/16/2018 3:00 PM) Atorvastatin Calcium (20MG  Tablet, Oral) Active. No Current Medications (Taken starting 11/16/2018) Medications Reconciled    Review of Systems [name]; 11/16/2018 3:33 PM) General Not Present- Chills and Fever. Skin Present- New Lesions. Not Present- Bruising. HEENT Not Present- Blurred Vision and Headache. Respiratory Not Present- Cough and Decreased Exercise Tolerance. Cardiovascular Not Present- Chest Pain and Difficulty Breathing On Exertion. Gastrointestinal Not Present- Bloody Stool, Nausea and Vomiting. Musculoskeletal Not Present- Back Pain and Decreased Range of Motion. Neurological Not Present- Decreased Memory and Difficulty Speaking. Psychiatric Not Present- Anxiety and Depression. Hematology Not Present- Abnormal Bleeding, Blood Clots and Blood Thinners. All other systems negative  Vitals [name]; 11/16/2018 3:00 PM) 11/16/2018 3:00 PM Weight: 242 lb Height: 70in Body Surface Area: 2.26 m Body Mass Index: 34.72 kg/m  Temp.: 98.32F (Oral)  Pulse: 72 (Regular)  BP: 134/80(Sitting, Left Arm, Standard)       Physical Exam [name]; 11/16/2018 3:35 PM) The physical exam findings are as follows: Note: Constitutional: No acute distress; conversant; no deformities; surgical mask in place Eyes: Moist conjunctiva; no lid lag; anicteric sclerae; pupils equal round and reactive to light Neck: Trachea midline; no palpable thyromegaly Lungs: Normal respiratory effort; no tactile fremitus CV: Regular rate and rhythm; no palpable thrill; no pitting edema GI: Abdomen obese, soft, nontender, nondistended; no palpable hepatosplenomegaly. 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 Colostomy is pink. Hypergranulation tissue that is heaped up on inferior border of stoma at mucocutaneous junction; this is now somewhat polypoid but  relatively soft,  not firm. Skin changes consistent with chronic moisture exposure as well. Anorectal: Anus surgically absent; scar well healed and normal in appearance, soft. 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 Sereno is a very pleasant 24yoM with hx of rectal cancer whom underwent exlap/APR 2008 by [name] - no noted neoadjuvant nor adjuvant therapy. Inferior aspect of his colostomy has developed a polypoid/hyper-granulation tissue-like appearance to it which is not normal in appearance either. 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 We discussed this and involve excising the inferior aspect of the mucocutaneous junction of the stoma and re-maturing this portion of the colostomy to the remaining skin if able. If further observation was employed and this was by chance a malignancy, we discussed progression. -The anatomy and physiology of the GI tract was discussed at length with the patient. The pathophysiology of this was discussed at length and need for removal for pathologic evaluation to ensure no malignancy is present. -The planned procedure, material risks (including, but not limited to, pain, bleeding, skin separation and open wound for period of time, need for blood transfusion, damage to surrounding structures- blood vessels/nerves/viscus/organs, need for additional procedures, recurrence, pneumonia, heart attack, stroke,  death) benefits and alternatives to surgery were discussed at length. I noted the purpose of this procedure being diagnostic and potentially therapeutic as well. The patient's questions were answered to his satisfaction, he voiced understanding and elected to proceed with surgery. Additionally, we discussed typical postoperative expectations and the recovery process. -We are additionally placing referral to the wound ostomy nurses in [facility] for assistance with refitting him with a better appliance and teaching moving forward -You should hear from surgery scheduling in the next 5 business days about a date for surgery. If you do not hear back in this time frame, please call back and ask for Dr. Orest Dikes nurse to discuss further  Signed electronically by Ileana Roup, Holden (11/16/2018 3:41 PM)

## 2018-12-07 DIAGNOSIS — Z933 Colostomy status: Secondary | ICD-10-CM | POA: Diagnosis not present

## 2018-12-08 DIAGNOSIS — Z933 Colostomy status: Secondary | ICD-10-CM | POA: Diagnosis not present

## 2018-12-30 NOTE — Patient Instructions (Addendum)
DUE TO COVID-19 ONLY ONE VISITOR IS ALLOWED TO COME WITH YOU AND STAY IN THE WAITING ROOM ONLY DURING PRE OP AND PROCEDURE DAY OF SURGERY. THE 1 VISITOR MAY VISIT WITH YOU AFTER SURGERY IN YOUR PRIVATE ROOM DURING VISITING HOURS ONLY!  YOU NEED TO HAVE A COVID 19 TEST ON___9-14-2020____ @___10 :00AM____, THIS TEST MUST BE DONE BEFORE SURGERY, COME  [address] , [phone].  ([place]) ONCE YOUR COVID TEST IS COMPLETED, PLEASE BEGIN THE QUARANTINE INSTRUCTIONS AS OUTLINED IN YOUR HANDOUT.                KIYAAN BLANKENBILLER    Your procedure is scheduled on: 01-05-2019   Report to [facility] Main  Entrance   Report to [facility] at 5:30AM     Call this number if you have problems the morning of surgery [phone]    Remember: Do not eat food or drink liquids :After Midnight. BRUSH YOUR TEETH MORNING OF SURGERY AND RINSE YOUR MOUTH OUT, NO CHEWING GUM CANDY OR MINTS.     Take these medicines the morning of surgery with A SIP OF WATER: ATORVASTATIN                                 You may not have any metal on your body including hair pins and              piercings  Do not wear jewelry, make-up, lotions, powders or perfumes, deodorant                         Men may shave face and neck.   Do not bring valuables to the hospital. [place].  Contacts, dentures or bridgework may not be worn into surgery.      Patients discharged the day of surgery will not be allowed to drive home. IF YOU ARE HAVING SURGERY AND GOING HOME THE SAME DAY, YOU MUST HAVE AN ADULT TO DRIVE YOU HOME AND BE WITH YOU FOR 24 HOURS. YOU MAY GO HOME BY TAXI OR UBER OR ORTHERWISE, BUT AN ADULT MUST ACCOMPANY YOU HOME AND STAY WITH YOU FOR 24 HOURS.  Name and phone number of your driver:  Special Instructions: N/A              Please read over the following fact sheets you were  given: _____________________________________________________________________             [facility] - Preparing for Surgery Before surgery, you can play an important role.  Because skin is not sterile, your skin needs to be as free of germs as possible.  You can reduce the number of germs on your skin by washing with CHG (chlorahexidine gluconate) soap before surgery.  CHG is an antiseptic cleaner which kills germs and bonds with the skin to continue killing germs even after washing. Please DO NOT use if you have an allergy to CHG or antibacterial soaps.  If your skin becomes reddened/irritated stop using the CHG and inform your nurse when you arrive at Short Stay. Do not shave (including legs and underarms) for at least 48 hours prior to the first CHG shower.  You may shave your face/neck. Please follow these instructions carefully:  1.  Shower with CHG  Soap the night before surgery and the  morning of Surgery.  2.  If you choose to wash your hair, wash your hair first as usual with your  normal  shampoo.  3.  After you shampoo, rinse your hair and body thoroughly to remove the  shampoo.                           4.  Use CHG as you would any other liquid soap.  You can apply chg directly  to the skin and wash                       Gently with a scrungie or clean washcloth.  5.  Apply the CHG Soap to your body ONLY FROM THE NECK DOWN.   Do not use on face/ open                           Wound or open sores. Avoid contact with eyes, ears mouth and genitals (private parts).                       Wash face,  Genitals (private parts) with your normal soap.             6.  Wash thoroughly, paying special attention to the area where your surgery  will be performed.  7.  Thoroughly rinse your body with warm water from the neck down.  8.  DO NOT shower/wash with your normal soap after using and rinsing off  the CHG Soap.                9.  Pat yourself dry with a clean towel.            10.  Wear  clean pajamas.            11.  Place clean sheets on your bed the night of your first shower and do not  sleep with pets. Day of Surgery : Do not apply any lotions/deodorants the morning of surgery.  Please wear clean clothes to the hospital/surgery center.  FAILURE TO FOLLOW THESE INSTRUCTIONS MAY RESULT IN THE CANCELLATION OF YOUR SURGERY PATIENT SIGNATURE_________________________________  NURSE SIGNATURE__________________________________  ________________________________________________________________________

## 2019-01-02 ENCOUNTER — Encounter (INDEPENDENT_AMBULATORY_CARE_PROVIDER_SITE_OTHER): Payer: Self-pay

## 2019-01-02 ENCOUNTER — Other Ambulatory Visit (HOSPITAL_COMMUNITY)
Admission: RE | Admit: 2019-01-02 | Discharge: 2019-01-02 | Disposition: A | Discharge status: Home / Self Care | Payer: PPO | Source: Ambulatory Visit | Attending: Surgery | Admitting: Surgery

## 2019-01-02 ENCOUNTER — Encounter (HOSPITAL_COMMUNITY): Payer: Self-pay

## 2019-01-02 ENCOUNTER — Other Ambulatory Visit: Payer: Self-pay

## 2019-01-02 ENCOUNTER — Encounter (HOSPITAL_COMMUNITY)
Admission: RE | Admit: 2019-01-02 | Discharge: 2019-01-02 | Disposition: A | Discharge status: Home / Self Care | Payer: PPO | Source: Ambulatory Visit | Attending: Surgery | Admitting: Surgery

## 2019-01-02 DIAGNOSIS — Z20828 Contact with and (suspected) exposure to other viral communicable diseases: Secondary | ICD-10-CM | POA: Diagnosis not present

## 2019-01-02 DIAGNOSIS — Z01818 Encounter for other preprocedural examination: Secondary | ICD-10-CM | POA: Diagnosis not present

## 2019-01-02 DIAGNOSIS — R9431 Abnormal electrocardiogram [ECG] [EKG]: Secondary | ICD-10-CM | POA: Diagnosis not present

## 2019-01-02 HISTORY — DX: Benign prostatic hyperplasia without lower urinary tract symptoms: N40.0

## 2019-01-02 HISTORY — DX: Syncope and collapse: R55

## 2019-01-02 HISTORY — DX: Hyperlipidemia, unspecified: E78.5

## 2019-01-02 LAB — CBC WITH DIFFERENTIAL/PLATELET
Abs Immature Granulocytes: 0.02 10*3/uL (ref 0.00–0.07)
Basophils Absolute: 0.1 10*3/uL (ref 0.0–0.1)
Basophils Relative: 1 %
Eosinophils Absolute: 0.3 10*3/uL (ref 0.0–0.5)
Eosinophils Relative: 4 %
HCT: 43.8 % (ref 39.0–52.0)
Hemoglobin: 14.6 g/dL (ref 13.0–17.0)
Immature Granulocytes: 0 %
Lymphocytes Relative: 32 %
Lymphs Abs: 2.5 10*3/uL (ref 0.7–4.0)
MCH: 30.6 pg (ref 26.0–34.0)
MCHC: 33.3 g/dL (ref 30.0–36.0)
MCV: 91.8 fL (ref 80.0–100.0)
Monocytes Absolute: 0.7 10*3/uL (ref 0.1–1.0)
Monocytes Relative: 8 %
Neutro Abs: 4.3 10*3/uL (ref 1.7–7.7)
Neutrophils Relative %: 55 %
Platelets: 243 10*3/uL (ref 150–400)
RBC: 4.77 MIL/uL (ref 4.22–5.81)
RDW: 12.9 % (ref 11.5–15.5)
WBC: 7.9 10*3/uL (ref 4.0–10.5)
nRBC: 0 % (ref 0.0–0.2)

## 2019-01-02 LAB — COMPREHENSIVE METABOLIC PANEL
ALT: 35 U/L (ref 0–44)
AST: 25 U/L (ref 15–41)
Albumin: 4.2 g/dL (ref 3.5–5.0)
Alkaline Phosphatase: 96 U/L (ref 38–126)
Anion gap: 6 (ref 5–15)
BUN: 19 mg/dL (ref 8–23)
CO2: 23 mmol/L (ref 22–32)
Calcium: 8.7 mg/dL — ABNORMAL LOW (ref 8.9–10.3)
Chloride: 110 mmol/L (ref 98–111)
Creatinine, Ser: 1.26 mg/dL — ABNORMAL HIGH (ref 0.61–1.24)
GFR calc Af Amer: >60 mL/min (ref >60)
GFR calc non Af Amer: 57 mL/min — ABNORMAL LOW (ref >60)
Glucose, Bld: 103 mg/dL — ABNORMAL HIGH (ref 70–99)
Potassium: 4.2 mmol/L (ref 3.5–5.1)
Sodium: 139 mmol/L (ref 135–145)
Total Bilirubin: 0.9 mg/dL (ref 0.3–1.2)
Total Protein: 7.2 g/dL (ref 6.5–8.1)

## 2019-01-02 LAB — PROTIME-INR
INR: 1 (ref 0.8–1.2)
Prothrombin Time: 13.5 seconds (ref 11.4–15.2)

## 2019-01-02 LAB — APTT: aPTT: 28 seconds (ref 24–36)

## 2019-01-02 NOTE — Progress Notes (Signed)
PCP - Merrilee Seashore, MD Cardiologist -   Chest x-ray -  EKG - done today , pending final confirmation  Stress Test -  ECHO -  Cardiac Cath -   Sleep Study -  CPAP -   Fasting Blood Sugar -  Checks Blood Sugar _____ times a day  Blood Thinner Instructions: Aspirin Instructions: Last Dose:  Anesthesia review:   Hx of vasovagal syncope. Reports has been known to pass out during blood draws. Denies any link to cardiac cause. No syncopal episode at pat appt today during blood draw .   Patient denies shortness of breath, fever, cough and chest pain at PAT appointment   Patient verbalized understanding of instructions that were given to them at the PAT appointment. Patient was also instructed that they will need to review over the PAT instructions again at home before surgery.

## 2019-01-03 LAB — NOVEL CORONAVIRUS, NAA (HOSP ORDER, SEND-OUT TO REF LAB; TAT 18-24 HRS): SARS-CoV-2, NAA: NOT DETECTED

## 2019-01-04 NOTE — Anesthesia Preprocedure Evaluation (Addendum)
Anesthesia Evaluation  Patient identified by MRN, date of birth, ID band Patient awake    Reviewed: Allergy & Precautions, NPO status , Patient's Chart, lab work & pertinent test results  History of Anesthesia Complications Negative for: history of anesthetic complications  Airway Mallampati: III  TM Distance: >3 FB Neck ROM: Full    Dental  (+) Dental Advisory Given, Teeth Intact   Pulmonary former smoker,    Pulmonary exam normal        Cardiovascular Normal cardiovascular exam   Vasovagal syncope    Neuro/Psych negative neurological ROS  negative psych ROS   GI/Hepatic Neg liver ROS,  Rectal cancer s/p excision with ostomy creation    Endo/Other   Obesity   Renal/GU Renal InsufficiencyRenal disease    BPH     Musculoskeletal  (+) Arthritis ,   Abdominal   Peds  Hematology negative hematology ROS (+)   Anesthesia Other Findings   Reproductive/Obstetrics                            Anesthesia Physical Anesthesia Plan  ASA: III  Anesthesia Plan: General   Post-op Pain Management:    Induction: Intravenous  PONV Risk Score and Plan: 2 and Treatment may vary due to age or medical condition, Ondansetron and Dexamethasone  Airway Management Planned: LMA  Additional Equipment: None  Intra-op Plan:   Post-operative Plan: Extubation in OR  Informed Consent: I have reviewed the patients History and Physical, chart, labs and discussed the procedure including the risks, benefits and alternatives for the proposed anesthesia with the patient or authorized representative who has indicated his/her understanding and acceptance.     Dental advisory given  Plan Discussed with: CRNA and Anesthesiologist  Anesthesia Plan Comments:        Anesthesia Quick Evaluation

## 2019-01-05 ENCOUNTER — Encounter (HOSPITAL_COMMUNITY)
Admission: RE | Disposition: A | Discharge status: Home / Self Care | Payer: Self-pay | Source: Home / Self Care | Attending: Surgery

## 2019-01-05 ENCOUNTER — Ambulatory Visit (HOSPITAL_COMMUNITY)
Admission: RE | Admit: 2019-01-05 | Discharge: 2019-01-05 | Disposition: A | Discharge status: Home / Self Care | Payer: PPO | Attending: Surgery | Admitting: Surgery

## 2019-01-05 ENCOUNTER — Ambulatory Visit (HOSPITAL_COMMUNITY): Payer: PPO | Admitting: Anesthesiology

## 2019-01-05 ENCOUNTER — Encounter (HOSPITAL_COMMUNITY): Payer: Self-pay

## 2019-01-05 ENCOUNTER — Ambulatory Visit (HOSPITAL_COMMUNITY): Payer: PPO | Admitting: Physician Assistant

## 2019-01-05 DIAGNOSIS — L089 Local infection of the skin and subcutaneous tissue, unspecified: Secondary | ICD-10-CM | POA: Diagnosis not present

## 2019-01-05 DIAGNOSIS — T859XXA Unspecified complication of internal prosthetic device, implant and graft, initial encounter: Secondary | ICD-10-CM | POA: Diagnosis present

## 2019-01-05 DIAGNOSIS — Z803 Family history of malignant neoplasm of breast: Secondary | ICD-10-CM | POA: Diagnosis not present

## 2019-01-05 DIAGNOSIS — Z433 Encounter for attention to colostomy: Secondary | ICD-10-CM | POA: Diagnosis not present

## 2019-01-05 DIAGNOSIS — N400 Enlarged prostate without lower urinary tract symptoms: Secondary | ICD-10-CM | POA: Diagnosis not present

## 2019-01-05 DIAGNOSIS — Z87891 Personal history of nicotine dependence: Secondary | ICD-10-CM | POA: Insufficient documentation

## 2019-01-05 DIAGNOSIS — E669 Obesity, unspecified: Secondary | ICD-10-CM | POA: Insufficient documentation

## 2019-01-05 DIAGNOSIS — R55 Syncope and collapse: Secondary | ICD-10-CM | POA: Insufficient documentation

## 2019-01-05 DIAGNOSIS — L98498 Non-pressure chronic ulcer of skin of other sites with other specified severity: Secondary | ICD-10-CM | POA: Insufficient documentation

## 2019-01-05 DIAGNOSIS — R229 Localized swelling, mass and lump, unspecified: Secondary | ICD-10-CM | POA: Diagnosis not present

## 2019-01-05 DIAGNOSIS — K9409 Other complications of colostomy: Secondary | ICD-10-CM | POA: Insufficient documentation

## 2019-01-05 DIAGNOSIS — Z86010 Personal history of colonic polyps: Secondary | ICD-10-CM | POA: Diagnosis not present

## 2019-01-05 DIAGNOSIS — Z6835 Body mass index (BMI) 35.0-35.9, adult: Secondary | ICD-10-CM | POA: Insufficient documentation

## 2019-01-05 DIAGNOSIS — E785 Hyperlipidemia, unspecified: Secondary | ICD-10-CM | POA: Diagnosis not present

## 2019-01-05 DIAGNOSIS — Z85048 Personal history of other malignant neoplasm of rectum, rectosigmoid junction, and anus: Secondary | ICD-10-CM | POA: Diagnosis not present

## 2019-01-05 DIAGNOSIS — Z8041 Family history of malignant neoplasm of ovary: Secondary | ICD-10-CM | POA: Insufficient documentation

## 2019-01-05 DIAGNOSIS — M1990 Unspecified osteoarthritis, unspecified site: Secondary | ICD-10-CM | POA: Insufficient documentation

## 2019-01-05 DIAGNOSIS — Z8249 Family history of ischemic heart disease and other diseases of the circulatory system: Secondary | ICD-10-CM | POA: Diagnosis not present

## 2019-01-05 DIAGNOSIS — Y838 Other surgical procedures as the cause of abnormal reaction of the patient, or of later complication, without mention of misadventure at the time of the procedure: Secondary | ICD-10-CM | POA: Insufficient documentation

## 2019-01-05 DIAGNOSIS — L98499 Non-pressure chronic ulcer of skin of other sites with unspecified severity: Secondary | ICD-10-CM | POA: Diagnosis not present

## 2019-01-05 DIAGNOSIS — R222 Localized swelling, mass and lump, trunk: Secondary | ICD-10-CM | POA: Diagnosis not present

## 2019-01-05 DIAGNOSIS — N289 Disorder of kidney and ureter, unspecified: Secondary | ICD-10-CM | POA: Diagnosis not present

## 2019-01-05 HISTORY — PX: INCISION AND DRAINAGE OF WOUND: SHX1803

## 2019-01-05 SURGERY — IRRIGATION AND DEBRIDEMENT WOUND
Anesthesia: General | Site: Abdomen

## 2019-01-05 MED ORDER — SODIUM CHLORIDE (PF) 0.9 % IJ SOLN
INTRAMUSCULAR | Status: AC
  Filled 2019-01-05: qty 20

## 2019-01-05 MED ORDER — BUPIVACAINE-EPINEPHRINE 0.5% -1:200000 IJ SOLN
INTRAMUSCULAR | Status: AC
  Filled 2019-01-05: qty 1

## 2019-01-05 MED ORDER — CHLORHEXIDINE GLUCONATE CLOTH 2 % EX PADS: 6.0000 | MEDICATED_PAD | Freq: Once | CUTANEOUS | Status: DC

## 2019-01-05 MED ORDER — OXYCODONE HCL 5 MG/5ML PO SOLN: 5.0000 mg | Freq: Once | ORAL | Status: DC | PRN

## 2019-01-05 MED ORDER — DEXAMETHASONE SODIUM PHOSPHATE 10 MG/ML IJ SOLN
INTRAMUSCULAR | Status: DC | PRN
  Administered 2019-01-05: 10 mg via INTRAVENOUS

## 2019-01-05 MED ORDER — BUPIVACAINE HCL (PF) 0.25 % IJ SOLN
INTRAMUSCULAR | Status: DC | PRN
  Administered 2019-01-05: 19 mL

## 2019-01-05 MED ORDER — ONDANSETRON HCL 4 MG/2ML IJ SOLN
INTRAMUSCULAR | Status: DC | PRN
  Administered 2019-01-05: 4 mg via INTRAVENOUS

## 2019-01-05 MED ORDER — FENTANYL CITRATE (PF) 100 MCG/2ML IJ SOLN
INTRAMUSCULAR | Status: DC | PRN
  Administered 2019-01-05 (×2): 25 ug via INTRAVENOUS

## 2019-01-05 MED ORDER — ONDANSETRON HCL 4 MG/2ML IJ SOLN
INTRAMUSCULAR | Status: AC
  Filled 2019-01-05: qty 2

## 2019-01-05 MED ORDER — LIDOCAINE 2% (20 MG/ML) 5 ML SYRINGE
INTRAMUSCULAR | Status: DC | PRN
  Administered 2019-01-05: 80 mg via INTRAVENOUS

## 2019-01-05 MED ORDER — ONDANSETRON HCL 4 MG/2ML IJ SOLN: 4.0000 mg | Freq: Once | INTRAMUSCULAR | Status: DC | PRN

## 2019-01-05 MED ORDER — FENTANYL CITRATE (PF) 100 MCG/2ML IJ SOLN: 25.0000 ug | INTRAMUSCULAR | Status: DC | PRN

## 2019-01-05 MED ORDER — FENTANYL CITRATE (PF) 100 MCG/2ML IJ SOLN
INTRAMUSCULAR | Status: AC
  Filled 2019-01-05: qty 2

## 2019-01-05 MED ORDER — LACTATED RINGERS IV SOLN
INTRAVENOUS | Status: DC
  Administered 2019-01-05: 06:00:00 via INTRAVENOUS

## 2019-01-05 MED ORDER — MIDAZOLAM HCL 5 MG/5ML IJ SOLN
INTRAMUSCULAR | Status: DC | PRN
  Administered 2019-01-05: 2 mg via INTRAVENOUS

## 2019-01-05 MED ORDER — 0.9 % SODIUM CHLORIDE (POUR BTL) OPTIME
TOPICAL | Status: DC | PRN
  Administered 2019-01-05: 1000 mL

## 2019-01-05 MED ORDER — MIDAZOLAM HCL 2 MG/2ML IJ SOLN
INTRAMUSCULAR | Status: AC
  Filled 2019-01-05: qty 2

## 2019-01-05 MED ORDER — LIDOCAINE 2% (20 MG/ML) 5 ML SYRINGE
INTRAMUSCULAR | Status: AC
  Filled 2019-01-05: qty 5

## 2019-01-05 MED ORDER — DEXAMETHASONE SODIUM PHOSPHATE 10 MG/ML IJ SOLN
INTRAMUSCULAR | Status: AC
  Filled 2019-01-05: qty 1

## 2019-01-05 MED ORDER — SODIUM CHLORIDE 0.9 % IV SOLN
2.0000 g | INTRAVENOUS | Status: AC
  Administered 2019-01-05: 2 g via INTRAVENOUS
  Filled 2019-01-05: qty 2

## 2019-01-05 MED ORDER — PROPOFOL 10 MG/ML IV BOLUS
INTRAVENOUS | Status: AC
  Filled 2019-01-05: qty 20

## 2019-01-05 MED ORDER — ACETAMINOPHEN 500 MG PO TABS
1000.0000 mg | ORAL_TABLET | ORAL | Status: AC
  Administered 2019-01-05: 1000 mg via ORAL
  Filled 2019-01-05: qty 2

## 2019-01-05 MED ORDER — PROPOFOL 10 MG/ML IV BOLUS
INTRAVENOUS | Status: DC | PRN
  Administered 2019-01-05: 180 mg via INTRAVENOUS

## 2019-01-05 MED ORDER — TRAMADOL HCL 50 MG PO TABS: 50.0000 mg | ORAL_TABLET | Freq: Four times a day (QID) | ORAL | 0 refills | Status: AC | PRN

## 2019-01-05 MED ORDER — OXYCODONE HCL 5 MG PO TABS: 5.0000 mg | ORAL_TABLET | Freq: Once | ORAL | Status: DC | PRN

## 2019-01-05 MED ORDER — BUPIVACAINE HCL (PF) 0.25 % IJ SOLN
INTRAMUSCULAR | Status: AC
  Filled 2019-01-05: qty 30

## 2019-01-05 SURGICAL SUPPLY — 29 items
COVER SURGICAL LIGHT HANDLE (MISCELLANEOUS) ×3 IMPLANT
COVER WAND RF STERILE (DRAPES) IMPLANT
DECANTER SPIKE VIAL GLASS SM (MISCELLANEOUS) ×3 IMPLANT
DERMABOND ADVANCED (GAUZE/BANDAGES/DRESSINGS) ×2
DERMABOND ADVANCED .7 DNX12 (GAUZE/BANDAGES/DRESSINGS) ×1 IMPLANT
DRAPE LAPAROSCOPIC ABDOMINAL (DRAPES) ×3 IMPLANT
ELECT PENCIL ROCKER SW 15FT (MISCELLANEOUS) ×3 IMPLANT
ELECT REM PT RETURN 15FT ADLT (MISCELLANEOUS) ×3 IMPLANT
GLOVE BIO SURGEON STRL SZ7.5 (GLOVE) ×2 IMPLANT
GLOVE BIOGEL PI IND STRL 7.5 (GLOVE) IMPLANT
GLOVE BIOGEL PI INDICATOR 7.5 (GLOVE) ×4
GLOVE INDICATOR 8.0 STRL GRN (GLOVE) ×2 IMPLANT
GLOVE SURG SS PI 7.0 STRL IVOR (GLOVE) ×3 IMPLANT
GOWN STRL REUS W/TWL XL LVL3 (GOWN DISPOSABLE) ×5 IMPLANT
KIT BASIN OR (CUSTOM PROCEDURE TRAY) ×3 IMPLANT
KIT TURNOVER KIT A (KITS) IMPLANT
NEEDLE HYPO 22GX1.5 SAFETY (NEEDLE) ×3 IMPLANT
PACK BASIC VI WITH GOWN DISP (CUSTOM PROCEDURE TRAY) ×3 IMPLANT
SPONGE LAP 4X18 RFD (DISPOSABLE) ×3 IMPLANT
SUT ETHILON 2 0 PSLX (SUTURE) ×2 IMPLANT
SUT MNCRL AB 4-0 PS2 18 (SUTURE) ×1 IMPLANT
SUT NOVA NAB GS-21 0 18 T12 DT (SUTURE) IMPLANT
SUT VIC AB 3-0 SH 18 (SUTURE) ×2 IMPLANT
SUT VIC AB 3-0 SH 27 (SUTURE)
SUT VIC AB 3-0 SH 27X BRD (SUTURE) ×1 IMPLANT
SYR CONTROL 10ML LL (SYRINGE) ×3 IMPLANT
TOWEL OR 17X26 10 PK STRL BLUE (TOWEL DISPOSABLE) ×3 IMPLANT
TOWEL OR NON WOVEN STRL DISP B (DISPOSABLE) ×3 IMPLANT
YANKAUER SUCT BULB TIP 10FT TU (MISCELLANEOUS) ×2 IMPLANT

## 2019-01-05 NOTE — Transfer of Care (Signed)
Immediate Anesthesia Transfer of Care Note  Patient: SEKANI MOTTL  Procedure(s) Performed: Excision of Peristomal skin mass (N/A Abdomen)  Patient Location: PACU  Anesthesia Type:General  Level of Consciousness: drowsy  Airway & Oxygen Therapy: Patient Spontanous Breathing and Patient connected to face mask oxygen  Post-op Assessment: Report given to RN and Post -op Vital signs reviewed and stable  Post vital signs: Reviewed and stable  Last Vitals:  Vitals Value Taken Time  BP    Temp    Pulse 66 01/05/19 0827  Resp 14 01/05/19 0827  SpO2 99 % 01/05/19 0827  Vitals shown include unvalidated device data.  Last Pain:  Vitals:   01/05/19 0550  TempSrc:   PainSc: 0-No pain         Complications: No apparent anesthesia complications

## 2019-01-05 NOTE — H&P (Signed)
CC: Peristomal skin - abnormal appearance  HPI: [name] is a very pleasant 65yoM with hx of rectal cancer whom underwent exlap/APR 04/2006 with [name]. He reports that he received no chemotherapy or radiation any point during his treatment course. He recovered well from everything. He has had surveillance endoscopy - last colonoscopy with with [name] in 2016, due to repeat next year. Just 16 he had 2 small polyp removed but the exam is otherwise normal. 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 He has noticed the inferior aspect of his colostomy has become more irritated and prone to bleeding. He has cut his appointment such that all of the skin beneath his colostomy is exposed. He has been followed by [name] who noted granulation tissue initially at this location and has proliferated sense. He was subsequently sent to me for further evaluation. He reports his weight is stable. He denies any other complaints. He denies any nausea/following/abdominal pain. He denies any blood tinged or bloody stool. He reports his colostomy is working well without issues. He denies any complaints regarding his perineum.  He was followed with surveillance imaging through 2011 and had no evidence of recurrence.  PMH: Rectal cancer s/p APR 2008.  PSH: Exlap/APR 2008. Denies any other abodminal operations.  FHx: Denies FHx of malignancy  Social: Denies use of tobacco/EtOH/drugs. 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  ROS: A comprehensive 10 system review of systems was completed with the patient and pertinent findings as noted above.  Past Medical History:  Diagnosis Date  . Arthritis   . BPH (benign prostatic hyperplasia)   . Colon polyp   . HLD (hyperlipidemia)   . Rectal cancer ([name])   . Vasovagal syncope     dx years ago , reprots " now i sometimes pass out when i get my blood drawn, but now i just get sweaty"     Past Surgical History:  Procedure Laterality Date  . COLONOSCOPY  2013  . POLYPECTOMY  08-28-11   3 polyps  . RECTAL SURGERY     with colostomy    Family History  Problem Relation Age of Onset  . Ovarian cancer Mother   . Breast cancer Mother   . Hyperlipidemia Father        mother  . Stomach cancer Neg Hx   . Colon cancer Neg Hx   . Esophageal cancer Neg Hx   . Prostate cancer Neg Hx   . Rectal cancer Neg Hx     Social:  reports that he has quit smoking. He has never used smokeless tobacco. He reports current alcohol use of about 1.0 standard drinks of alcohol per week. He reports that he does not use drugs.  Allergies: No Known Allergies  Medications: I have reviewed the patient's current medications.  No results found for this or any previous visit (from the past 48 hour(s)).  No results found.  ROS - all of the below systems have been reviewed with the patient and positives are indicated with bold text General: chills, fever or night sweats Eyes: blurry vision or double vision ENT: epistaxis or sore throat Allergy/Immunology: itchy/watery eyes or nasal congestion Hematologic/Lymphatic: bleeding problems, blood clots or swollen lymph nodes Endocrine: temperature intolerance or unexpected weight changes Breast: new or changing breast lumps or  nipple discharge Resp: cough, shortness of breath, or wheezing CV: chest pain or dyspnea on exertion GI: as per HPI GU: dysuria, trouble voiding, or hematuria MSK: joint pain or joint stiffness Neuro: TIA or stroke symptoms Derm: pruritus and skin lesion changes Psych: anxiety and depression  PE Blood pressure 135/77, pulse (!) 54, temperature 98.3 F (36.8 C), temperature source Oral, resp. rate 18, height 5\' 10"  (1.778 m), weight 111.3 kg, SpO2 98 %. Constitutional: NAD; conversant; no deformities Eyes: Moist  conjunctiva; no lid lag; anicteric; PERRL Neck: Trachea midline; no thyromegaly Lungs: Normal respiratory effort; no tactile fremitus CV: RRR; no palpable thrills; no pitting edema GI: Abd soft, NT/ND; appliance in place; no palpable hepatosplenomegaly MSK: Normal gait; no clubbing/cyanosis Psychiatric: Appropriate affect; alert and oriented x3 Lymphatic: No palpable cervical or axillary lymphadenopathy  No results found for this or any previous visit (from the past 48 hour(s)).  No results found.   A/P: [name] is a very pleasant 29yoM with hx of rectal cancer whom underwent exlap/APR 2008 by [name] - no noted neoadjuvant nor adjuvant therapy. Inferior aspect of his colostomy has developed a polypoid/hyper-granulation tissue-like appearance to it which is not normal in appearance either. 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 We discussed this and involve excising the inferior aspect of the mucocutaneous junction of the stoma and re-maturing this portion of the colostomy to the remaining skin if able. If further observation was employed and this was by chance a malignancy, we discussed progression. -The anatomy and physiology of the GI tract was discussed at length with the patient. The pathophysiology of this was discussed at length and need for removal for pathologic evaluation to ensure no malignancy is present. -The planned procedure, material risks (including, but not limited to, pain, bleeding, skin separation and open wound for period of time, need for blood transfusion, damage to surrounding structures- blood vessels/nerves/viscus/organs, need for additional procedures, recurrence, pneumonia, heart  attack, stroke, death) benefits and alternatives to surgery were discussed at length. I noted the purpose of this procedure being diagnostic and potentially therapeutic as well. The patient's questions were answered to his satisfaction, he voiced understanding and elected to proceed with surgery. Additionally, we discussed typical postoperative expectations and the recovery process. -We are additionally placing referral to the wound ostomy nurses in [facility] for assistance with refitting him with a better appliance and teaching moving forward -You should hear from surgery scheduling in the next 5 business days about a date for surgery. If you do not hear back in this time frame, please call back and ask for [name] nurse to discuss further  [name], M.D. [facility] [phone]

## 2019-01-05 NOTE — Anesthesia Postprocedure Evaluation (Signed)
Anesthesia Post Note  Patient: [name]  Procedure(s) Performed: Excision of Peristomal skin mass (N/A Abdomen)     Patient location during evaluation: PACU Anesthesia Type: General Level of consciousness: awake and alert Pain management: pain level controlled Vital Signs Assessment: post-procedure vital signs reviewed and stable Respiratory status: spontaneous breathing, nonlabored ventilation and respiratory function stable Cardiovascular status: blood pressure returned to baseline and stable Postop Assessment: no apparent nausea or vomiting Anesthetic complications: no    Last Vitals:  Vitals:   01/05/19 0830 01/05/19 0845  BP: 128/83 119/71  Pulse: (!) 52 (!) 54  Resp: 15 (!) 6  Temp: 36.7 C   SpO2: 100% 97%    Last Pain:  Vitals:   01/05/19 0845  TempSrc:   PainSc: 0-No pain                 Audry Pili

## 2019-01-05 NOTE — Discharge Instructions (Addendum)
POST OP INSTRUCTIONS  DIET: As tolerated.   1. Take your usually prescribed home medications unless otherwise directed.  2. PAIN CONTROL: a. Pain is best controlled by a usual combination of different methods TOGETHER: i. Over the counter pain medication ii. Prescription pain medication b. Most patients will experience some swelling and bruising around the surgical site.   c. It is helpful to take an over-the-counter pain medication regularly for pain if needed: i. Acetaminophen (Tylenol) - you may take 650mg  every 6 hours as needed. If you are taking a narcotic pain medication that has acetaminophen in it, do not take over the counter tylenol at the same time. ii. Given your mild kidney impairment - would try to avoid NSAIDs like ibuprofen, advil, motrin, etc d. A  prescription for pain medication for breakthrough pain has been sent to your pharmacy - tramadol.  Take your pain medication as prescribed if your pain is not adequatly controlled with the over-the-counter pain reliefs mentioned above.  3. Avoid getting constipated.  Between the surgery and the pain medications, it is common to experience some constipation.  Increasing fluid intake and taking a fiber supplement (such as Metamucil, Citrucel, FiberCon, MiraLax, etc) 1-2 times a day regularly will usually help prevent this problem from occurring.  A mild laxative (prune juice, Milk of Magnesia, MiraLax, etc) should be taken according to package directions if there are no bowel movements after 48 hours.    4. Colostomy: The suture used to bring the colostomy back to the skin edge is dissolvable and will go away on its on in the first couple months. Cut your bag to fit right around the colostomy edges and try to avoid exposing normal skin to the stoma appliance and stool if you can.  5. ACTIVITIES as tolerated  6. FOLLOW UP in our office a. Please call [facility] at [phone] to set up an appointment to see your surgeon in the office for  a follow-up appointment approximately 2 weeks after your surgery. b. Make sure that you call for this appointment the day you arrive home to insure a convenient appointment time.  9. If you have disability or family leave forms that need to be completed, you may have them completed by your primary care physician's office; for return to work instructions, please ask our office staff and they will be happy to assist you in obtaining this documentation   When to call us [phone]: 1. Poor pain control 2. Reactions / problems with new medications (rash/itching, etc)  3. Fever over 101.5 F (38.5 C) 4. Inability to urinate 5. Nausea/vomiting 6. Worsening swelling or bruising 7. Continued bleeding from incision. 8. Increased pain, redness, or drainage from the incision  The clinic staff is available to answer your questions during regular business hours (8:30am-5pm).  Please dont hesitate to call and ask to speak to one of our nurses for clinical concerns.   A surgeon from [facility] is always on call at the hospitals   If you have a medical emergency, go to the nearest emergency room or call 911.  [facility], [place] [address] [place], [place], [place]  [address] MAIN: [phone] FAX: [phone] [email or web address]

## 2019-01-05 NOTE — Op Note (Signed)
01/05/2019  8:29 AM  PATIENT:  [name]  68 y.o. male  Patient Care Team: Merrilee Seashore, MD as PCP - General (Internal Medicine) Merrilee Seashore, MD as PCP - Internal Medicine (Internal Medicine)  PRE-OPERATIVE DIAGNOSIS:  Abnormal appearing colostomy and peristomal skin  POST-OPERATIVE DIAGNOSIS:  Same  PROCEDURE:  Revision of colostomy by excision of colostomy edge and associated skin  SURGEON:  Justin Mt. Natividad Schlosser, MD  ANESTHESIA:   local and general  COUNTS:  Sponge, needle and instrument counts were reported correct x2 at the conclusion of the operation.  EBL: 2 mL  DRAINS: None  SPECIMEN:  1. Colostomy + peristomal skin - short suture superior (colostomy side), long suture lateral (patient's left) 2. Colostomy nodule - superior 3. Colostomy nodule - medial  COMPLICATIONS: None  FINDINGS: Heaped up firm/nodular colostomy and peristomal skin on inferior ~1/3 of entire colostomy. This was excised, oriented and submitted. 2 additional areas removed and both sent separately. Colostomy then matured out to normal skin with suture  DISPOSITION: PACU in satisfactory condition  INDICATION: [name] is a very pleasant 18yoM with hx of rectal cancer whom underwent exlap/APR 04/2006 with Dr. Neil Holden. He reported that he received no chemotherapy or radiation therapy at any point during his treatment course. He recovered well from everything. He has had surveillance endoscopy - last colonoscopy with with Dr. Ardis Holden in 2016, due to repeat next year. In 2016 he had 2 small polyp removed but the exam is otherwise normal. 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 He has noticed the inferior aspect of his colostomy has become more irritated and prone to bleeding. He has cut his appliance such that all of the skin beneath his  colostomy is exposed. He has been followed by [name] who noted granulation tissue initially at this location and has proliferated sense. He was subsequently sent to me for further evaluation. He reports his weight is stable. He denies any other complaints. He denies any nausea/following/abdominal pain. He denies any blood tinged or bloody stool. He reports his colostomy is working well without issues. He denies any complaints regarding his perineum.  He was followed with surveillance imaging through 2011 and had no evidence of recurrence.  He was examined in the office and had fairly abnormal appearing colostomy/skin on inferior border particularly which was heaped up and nodular/firm. We had discussed proceeding with excision of all this and re-maturing this portion of his colostomy to normal skin. Please refer to notes elsewhere for details regarding this discussion.  DESCRIPTION: The patient was identified in preop holding and taken to the OR where he was placed on the operating room table. SCDs were placed. General anesthesia was induced without difficulty.  Pressure points were checked and padded.  His colostomy appliance was removed, and he was then prepped and draped in the usual sterile fashion. A surgical timeout was performed indicating the correct patient, procedure, positioning and need for preoperative antibiotics.   The abnormal appearing inferior border of his colostomy noted is approximately one third of the circumference was identified.  This was excised sharply a small piece including a rim of normal skin as well as the distal aspect of his colostomy/colon.  There was normal mucosa on the order as well.  This was then oriented with a short stitch marking the superior edge (colon side)  and a long stitch marking the lateral (patient's left side). The deep margin of all this was subcutaneous fat Hemostasis was then achieved with electrocautery.  A colon wall was then matured back  out to the skin using 3-0 Vicryl interrupted sutures.  2 other smaller areas that appeared somewhat abnormal but likely hyper granulation tissue were identified one being on the superior aspect of the colostomy and the other being on the medial side.  These were both excised sharply and passed off separately as specimen.  The colon was matured to the skin edge on these areas using 3-0 Vicryl sutures as well.  Everything appeared hemostatic.  An appliance was then cut to fit.  This was applied.  He was then awakened from anesthesia, extubated, and transferred to a stretcher for transport to PACU in satisfactory condition.

## 2019-01-05 NOTE — Anesthesia Procedure Notes (Signed)
Procedure Name: LMA Insertion Date/Time: 01/05/2019 7:36 AM Performed by: Sharlette Dense, CRNA Patient Re-evaluated:Patient Re-evaluated prior to induction Oxygen Delivery Method: Circle system utilized Preoxygenation: Pre-oxygenation with 100% oxygen Induction Type: IV induction LMA: LMA inserted LMA Size: 4.0 Number of attempts: 1 Placement Confirmation: positive ETCO2 and breath sounds checked- equal and bilateral Tube secured with: Tape Dental Injury: Teeth and Oropharynx as per pre-operative assessment

## 2019-01-06 ENCOUNTER — Encounter (HOSPITAL_COMMUNITY): Payer: Self-pay | Admitting: Surgery

## 2019-01-10 LAB — SURGICAL PATHOLOGY

## 2019-02-18 DIAGNOSIS — R110 Nausea: Secondary | ICD-10-CM | POA: Diagnosis not present

## 2019-02-18 DIAGNOSIS — R509 Fever, unspecified: Secondary | ICD-10-CM | POA: Diagnosis not present

## 2019-02-18 DIAGNOSIS — Z9189 Other specified personal risk factors, not elsewhere classified: Secondary | ICD-10-CM | POA: Diagnosis not present

## 2019-02-18 DIAGNOSIS — R197 Diarrhea, unspecified: Secondary | ICD-10-CM | POA: Diagnosis not present

## 2019-03-03 DIAGNOSIS — E782 Mixed hyperlipidemia: Secondary | ICD-10-CM | POA: Diagnosis not present

## 2019-03-03 DIAGNOSIS — R39198 Other difficulties with micturition: Secondary | ICD-10-CM | POA: Diagnosis not present

## 2019-03-03 DIAGNOSIS — R7301 Impaired fasting glucose: Secondary | ICD-10-CM | POA: Diagnosis not present

## 2019-03-07 DIAGNOSIS — R7301 Impaired fasting glucose: Secondary | ICD-10-CM | POA: Diagnosis not present

## 2019-03-07 DIAGNOSIS — J45909 Unspecified asthma, uncomplicated: Secondary | ICD-10-CM | POA: Diagnosis not present

## 2019-03-07 DIAGNOSIS — N400 Enlarged prostate without lower urinary tract symptoms: Secondary | ICD-10-CM | POA: Diagnosis not present

## 2019-03-07 DIAGNOSIS — E782 Mixed hyperlipidemia: Secondary | ICD-10-CM | POA: Diagnosis not present

## 2019-03-07 DIAGNOSIS — Z23 Encounter for immunization: Secondary | ICD-10-CM | POA: Diagnosis not present

## 2019-03-27 DIAGNOSIS — Z933 Colostomy status: Secondary | ICD-10-CM | POA: Diagnosis not present

## 2019-04-19 DIAGNOSIS — Z20828 Contact with and (suspected) exposure to other viral communicable diseases: Secondary | ICD-10-CM | POA: Diagnosis not present

## 2019-04-21 DIAGNOSIS — Z933 Colostomy status: Secondary | ICD-10-CM | POA: Diagnosis not present

## 2019-06-05 DIAGNOSIS — Z933 Colostomy status: Secondary | ICD-10-CM | POA: Diagnosis not present

## 2019-06-15 DIAGNOSIS — C44311 Basal cell carcinoma of skin of nose: Secondary | ICD-10-CM | POA: Diagnosis not present

## 2019-07-20 DIAGNOSIS — M79672 Pain in left foot: Secondary | ICD-10-CM | POA: Diagnosis not present

## 2019-07-20 DIAGNOSIS — M7742 Metatarsalgia, left foot: Secondary | ICD-10-CM | POA: Diagnosis not present

## 2019-08-02 DIAGNOSIS — E782 Mixed hyperlipidemia: Secondary | ICD-10-CM | POA: Diagnosis not present

## 2019-08-02 DIAGNOSIS — R29818 Other symptoms and signs involving the nervous system: Secondary | ICD-10-CM | POA: Diagnosis not present

## 2019-08-02 DIAGNOSIS — R001 Bradycardia, unspecified: Secondary | ICD-10-CM | POA: Diagnosis not present

## 2019-08-02 DIAGNOSIS — R002 Palpitations: Secondary | ICD-10-CM | POA: Diagnosis not present

## 2019-08-02 DIAGNOSIS — R7301 Impaired fasting glucose: Secondary | ICD-10-CM | POA: Diagnosis not present

## 2019-08-31 DIAGNOSIS — E782 Mixed hyperlipidemia: Secondary | ICD-10-CM | POA: Diagnosis not present

## 2019-08-31 DIAGNOSIS — R29818 Other symptoms and signs involving the nervous system: Secondary | ICD-10-CM | POA: Diagnosis not present

## 2019-08-31 DIAGNOSIS — R7301 Impaired fasting glucose: Secondary | ICD-10-CM | POA: Diagnosis not present

## 2019-08-31 DIAGNOSIS — R001 Bradycardia, unspecified: Secondary | ICD-10-CM | POA: Diagnosis not present

## 2019-09-01 DIAGNOSIS — R0683 Snoring: Secondary | ICD-10-CM | POA: Diagnosis not present

## 2019-09-01 DIAGNOSIS — E119 Type 2 diabetes mellitus without complications: Secondary | ICD-10-CM | POA: Diagnosis not present

## 2019-09-01 DIAGNOSIS — G4733 Obstructive sleep apnea (adult) (pediatric): Secondary | ICD-10-CM | POA: Diagnosis not present

## 2019-09-01 DIAGNOSIS — G4719 Other hypersomnia: Secondary | ICD-10-CM | POA: Diagnosis not present

## 2019-09-27 DIAGNOSIS — Z933 Colostomy status: Secondary | ICD-10-CM | POA: Diagnosis not present

## 2019-09-29 DIAGNOSIS — Z933 Colostomy status: Secondary | ICD-10-CM | POA: Diagnosis not present

## 2019-10-11 ENCOUNTER — Encounter: Payer: Self-pay | Admitting: Nurse Practitioner

## 2019-10-11 DIAGNOSIS — G4733 Obstructive sleep apnea (adult) (pediatric): Secondary | ICD-10-CM | POA: Diagnosis not present

## 2019-10-11 NOTE — Telephone Encounter (Signed)
erron

## 2019-11-10 DIAGNOSIS — G4733 Obstructive sleep apnea (adult) (pediatric): Secondary | ICD-10-CM | POA: Diagnosis not present

## 2019-12-08 DIAGNOSIS — G4733 Obstructive sleep apnea (adult) (pediatric): Secondary | ICD-10-CM | POA: Diagnosis not present

## 2019-12-08 DIAGNOSIS — E119 Type 2 diabetes mellitus without complications: Secondary | ICD-10-CM | POA: Diagnosis not present

## 2019-12-11 DIAGNOSIS — G4733 Obstructive sleep apnea (adult) (pediatric): Secondary | ICD-10-CM | POA: Diagnosis not present

## 2020-01-18 DIAGNOSIS — X32XXXD Exposure to sunlight, subsequent encounter: Secondary | ICD-10-CM | POA: Diagnosis not present

## 2020-01-18 DIAGNOSIS — H6983 Other specified disorders of Eustachian tube, bilateral: Secondary | ICD-10-CM | POA: Diagnosis not present

## 2020-01-18 DIAGNOSIS — L814 Other melanin hyperpigmentation: Secondary | ICD-10-CM | POA: Diagnosis not present

## 2020-01-18 DIAGNOSIS — Z85828 Personal history of other malignant neoplasm of skin: Secondary | ICD-10-CM | POA: Diagnosis not present

## 2020-01-18 DIAGNOSIS — L820 Inflamed seborrheic keratosis: Secondary | ICD-10-CM | POA: Diagnosis not present

## 2020-01-18 DIAGNOSIS — Z08 Encounter for follow-up examination after completed treatment for malignant neoplasm: Secondary | ICD-10-CM | POA: Diagnosis not present

## 2020-01-18 DIAGNOSIS — B078 Other viral warts: Secondary | ICD-10-CM | POA: Diagnosis not present

## 2020-01-18 DIAGNOSIS — L570 Actinic keratosis: Secondary | ICD-10-CM | POA: Diagnosis not present

## 2020-01-18 DIAGNOSIS — H903 Sensorineural hearing loss, bilateral: Secondary | ICD-10-CM | POA: Diagnosis not present

## 2020-01-30 DIAGNOSIS — G4733 Obstructive sleep apnea (adult) (pediatric): Secondary | ICD-10-CM | POA: Diagnosis not present

## 2020-02-10 DIAGNOSIS — G4733 Obstructive sleep apnea (adult) (pediatric): Secondary | ICD-10-CM | POA: Diagnosis not present

## 2020-02-26 DIAGNOSIS — Z933 Colostomy status: Secondary | ICD-10-CM | POA: Diagnosis not present

## 2020-03-08 DIAGNOSIS — H903 Sensorineural hearing loss, bilateral: Secondary | ICD-10-CM | POA: Diagnosis not present

## 2020-03-08 DIAGNOSIS — H6983 Other specified disorders of Eustachian tube, bilateral: Secondary | ICD-10-CM | POA: Diagnosis not present

## 2020-03-12 DIAGNOSIS — G4733 Obstructive sleep apnea (adult) (pediatric): Secondary | ICD-10-CM | POA: Diagnosis not present

## 2020-03-21 DIAGNOSIS — G4733 Obstructive sleep apnea (adult) (pediatric): Secondary | ICD-10-CM | POA: Diagnosis not present

## 2020-04-04 DIAGNOSIS — G4733 Obstructive sleep apnea (adult) (pediatric): Secondary | ICD-10-CM | POA: Diagnosis not present

## 2020-04-10 DIAGNOSIS — Z0000 Encounter for general adult medical examination without abnormal findings: Secondary | ICD-10-CM | POA: Diagnosis not present

## 2020-04-10 DIAGNOSIS — R7301 Impaired fasting glucose: Secondary | ICD-10-CM | POA: Diagnosis not present

## 2020-04-10 DIAGNOSIS — R29818 Other symptoms and signs involving the nervous system: Secondary | ICD-10-CM | POA: Diagnosis not present

## 2020-04-10 DIAGNOSIS — Z23 Encounter for immunization: Secondary | ICD-10-CM | POA: Diagnosis not present

## 2020-04-10 DIAGNOSIS — E782 Mixed hyperlipidemia: Secondary | ICD-10-CM | POA: Diagnosis not present

## 2020-04-10 DIAGNOSIS — R001 Bradycardia, unspecified: Secondary | ICD-10-CM | POA: Diagnosis not present

## 2020-04-11 DIAGNOSIS — G4733 Obstructive sleep apnea (adult) (pediatric): Secondary | ICD-10-CM | POA: Diagnosis not present

## 2020-05-03 DIAGNOSIS — Z0000 Encounter for general adult medical examination without abnormal findings: Secondary | ICD-10-CM | POA: Diagnosis not present

## 2020-05-03 DIAGNOSIS — E782 Mixed hyperlipidemia: Secondary | ICD-10-CM | POA: Diagnosis not present

## 2020-05-03 DIAGNOSIS — Z933 Colostomy status: Secondary | ICD-10-CM | POA: Diagnosis not present

## 2020-05-03 DIAGNOSIS — G4733 Obstructive sleep apnea (adult) (pediatric): Secondary | ICD-10-CM | POA: Diagnosis not present

## 2020-05-03 DIAGNOSIS — R7301 Impaired fasting glucose: Secondary | ICD-10-CM | POA: Diagnosis not present

## 2020-05-03 DIAGNOSIS — J45909 Unspecified asthma, uncomplicated: Secondary | ICD-10-CM | POA: Diagnosis not present

## 2020-05-08 DIAGNOSIS — Z933 Colostomy status: Secondary | ICD-10-CM | POA: Diagnosis not present

## 2020-06-04 ENCOUNTER — Other Ambulatory Visit: Payer: Self-pay

## 2020-06-06 DIAGNOSIS — E782 Mixed hyperlipidemia: Secondary | ICD-10-CM | POA: Insufficient documentation

## 2020-06-06 DIAGNOSIS — R7301 Impaired fasting glucose: Secondary | ICD-10-CM | POA: Insufficient documentation

## 2020-06-06 DIAGNOSIS — G4733 Obstructive sleep apnea (adult) (pediatric): Secondary | ICD-10-CM

## 2020-06-06 DIAGNOSIS — N400 Enlarged prostate without lower urinary tract symptoms: Secondary | ICD-10-CM | POA: Insufficient documentation

## 2020-06-06 DIAGNOSIS — J45909 Unspecified asthma, uncomplicated: Secondary | ICD-10-CM | POA: Insufficient documentation

## 2020-06-06 DIAGNOSIS — Z933 Colostomy status: Secondary | ICD-10-CM | POA: Insufficient documentation

## 2020-06-06 HISTORY — DX: Obstructive sleep apnea (adult) (pediatric): G47.33

## 2020-06-07 ENCOUNTER — Ambulatory Visit: Payer: Medicare Other | Admitting: Nurse Practitioner

## 2020-06-07 ENCOUNTER — Encounter: Payer: Self-pay | Admitting: Nurse Practitioner

## 2020-06-07 VITALS — BP 128/66 | HR 72 | Ht 70.0 in | Wt 248.0 lb

## 2020-06-07 DIAGNOSIS — R1310 Dysphagia, unspecified: Secondary | ICD-10-CM | POA: Diagnosis not present

## 2020-06-07 DIAGNOSIS — C20 Malignant neoplasm of rectum: Secondary | ICD-10-CM

## 2020-06-07 DIAGNOSIS — Z86010 Personal history of colonic polyps: Secondary | ICD-10-CM | POA: Diagnosis not present

## 2020-06-07 MED ORDER — SUPREP BOWEL PREP KIT 17.5-3.13-1.6 GM/177ML PO SOLN: 1.0000 | ORAL | 0 refills | Status: DC

## 2020-06-07 NOTE — Progress Notes (Signed)
06/07/2020 [name] [id number] [birth year]   Chief Complaint: Dysphagia, schedule a follow-up colonoscopy  History of Present Illness: [name] is a 70 year old male with a past medical history of arthritis, BPH, obstructive sleep apnea and rectal cancer s/p APR for low lying T2N0 rectal adenocarcinoma in 2008. No chemo or radiation. S/P revision of colostomy by excision of colostomy edge and associated skin by Dr. Nadeen Landau 01/05/2019.   He presents to our office today for further evaluation regarding dysphagia and to schedule a surveillance colonoscopy.  He complains of having dysphagia which occurs daily for the past year.  He describes having food which gets stuck to the upper esophagus which occurs almost every time he eats.  No specific food triggers.  He often coughs while he eats.  When the food becomes stuck he stops eating and walks around, sometimes coughs and the food typically passes down the esophagus.  He describes feeling choked during these episodes but he is able to breathe and speak without being in distress.  He has heartburn only if he eats spicy foods which is about once weekly.  He is prescribed Pantoprazole 40 mg daily but admits to taking it 2 or 3 days weekly.  No NSAID use.  No alcohol use.  He denies having any upper or lower abdominal pain.  His ostomy produces a moderate amount of thick brown stool, he empties the ostomy bag at least twice daily.  He occasionally sees streaks of blood around the stoma site.  No black stool. His most recent colonoscopy was 12/07/2014 and 2 tubular adenomatous polyps were removed from the a sending colon.  Laboratory studies 04/10/2020: WBC 8.6.  Hemoglobin 15.8.  Hematocrit 46.4.  Platelet 227.  Glucose 109.  BUN 14.  Creatinine 1.12.  Sodium 142.  Potassium 5.0.  Total protein 6.6.  Total bili 0.4.  Alk phos 107.  AST 24.  ALT 39.  Total bili 0.4.  Colonoscopy 12/07/2014: 1. Two sessile polyps ranging between 3-20mm in  size were found in the ascending colon; polypectomies were performed with cold forceps and with a cold snare 2. The examination was otherwise normal (via ostomy) - 5 year recall colonoscopy  Surgical [P], ascending, polyp (2) - TUBULAR ADENOMA (X2). - NO HIGH GRADE DYSPLASIA OR MALIGNANCY  Past Medical History:  Diagnosis Date  . Arthritis   . BPH (benign prostatic hyperplasia)   . Colon polyp   . HLD (hyperlipidemia)   . Obstructive sleep apnea 06/06/2020  . Rectal cancer ([place])   . Vasovagal syncope    dx years ago , reprots " now i sometimes pass out when i get my blood drawn, but now i just get sweaty"     Past Surgical History:  Procedure Laterality Date  . COLONOSCOPY  2013  . INCISION AND DRAINAGE OF WOUND N/A 01/05/2019   Procedure: Excision of Peristomal skin mass;  Surgeon: Ileana Roup, MD;  Location: [facility];  Service: General;  Laterality: N/A;  . POLYPECTOMY  08-28-11   3 polyps  . RECTAL SURGERY     with colostomy   Social History: He is married.  Non-smoker.  No alcohol or drug use.  Family History  Problem Relation Age of Onset  . Ovarian cancer Mother   . Breast cancer Mother   . Hyperlipidemia Father        mother  . Stomach cancer Neg Hx   . Colon cancer Neg Hx   .  Esophageal cancer Neg Hx   . Prostate cancer Neg Hx   . Rectal cancer Neg Hx     Current Outpatient Medications on File Prior to Visit  Medication Sig Dispense Refill  . atorvastatin (LIPITOR) 20 MG tablet Take 20 mg by mouth daily.    . Multiple Vitamin (MULTIVITAMIN) tablet Take 1 tablet by mouth daily.    . pantoprazole (PROTONIX) 40 MG tablet Take 1 tablet by mouth daily.    . Probiotic Product (PROBIOTIC DAILY PO) Take 1 capsule by mouth daily.    . vitamin C (ASCORBIC ACID) 500 MG tablet Take 500 mg by mouth daily.     No current facility-administered medications on file prior to visit.   No Known Allergies  Current Medications, Allergies, Past Medical History, Past  Surgical History, Family History and Social History were reviewed in [employer] record.   Review of Systems:   Constitutional: Negative for fever, sweats, chills or weight loss.  Respiratory: Negative for shortness of breath.   Cardiovascular: Negative for chest pain, palpitations and leg swelling.  Gastrointestinal: See HPI.  Musculoskeletal: Negative for back pain or muscle aches.  Neurological: Negative for dizziness, headaches or paresthesias.    Physical Exam: BP 128/66   Pulse 72   Ht $R'5\' 10"'VK$  (1.778 m)   Wt 248 lb (112.5 kg)   BMI 35.58 kg/m  General: Well developed 70 year old male in no acute distress. Head: Normocephalic and atraumatic. Eyes: No scleral icterus. Conjunctiva pink . Ears: Normal auditory acuity. Mouth: Dentition intact. No ulcers or lesions.  Lungs: Clear throughout to auscultation. Heart: Regular rate and rhythm, no murmur. Abdomen: Soft, nontender and nondistended. No masses or hepatomegaly. Normal bowel sounds x 4 quadrants. Left mid abdominal ostomy intact, stoma beefy red, a few bloody streaks around the stoma border, no obvious parastomal hernia, thick brown stool in ostomy bag.  Rectal: Deferred.  Musculoskeletal: Symmetrical with no gross deformities. Extremities: No edema. Neurological: Alert oriented x 4. No focal deficits.  Psychological: Alert and cooperative. Normal mood and affect  Assessment and Recommendations:  50. 70 year old male with a history of GERD presents with dysphagia which occurs daily x 1 year.  Infrequent heartburn occurs if he eats spicy foods once weekly.  He is noncompliant with taking pantoprazole 40 mg daily and reports taking his medication 3 days weekly. -EGD with possible esophageal dilatation benefits and risks discussed including risk with sedation, risk of bleeding, perforation and infection  -Recommend taking Pantoprazole 40 mg 1 p.o. daily -Avoid spicy foods -Patient to call our office if  his symptoms worsen  2.  History of colon polyps and rectal cancer s/p APR for low lying T2N0 rectal adenocarcinoma in 2008.  2 tubular adenomatous polyps removed from the ascending colon at the time of his most recent colonoscopy 11/2014 -Colonoscopy via ostomy benefits and risks discussed including risk with sedation, risk of bleeding, perforation and infection   3. OSA

## 2020-06-07 NOTE — Patient Instructions (Addendum)
If you are age 72 or older, your body mass index should be between 23-30. Your Body mass index is 35.58 kg/m. If this is out of the aforementioned range listed, please consider follow up with your Primary Care Provider.  PROCEDURES:  You have been scheduled for an endoscopy and colonoscopy. Please follow the written instructions given to you at your visit today. Please pick up your prep supplies at the pharmacy within the next 1-3 days. If you use inhalers (even only as needed), please bring them with you on the day of your procedure.  Please continue taking Pantoprazole 40 MG once a day.  Please call our office if your symptoms worsen.  It was great seeing you today! Thank you for entrusting me with your care and choosing [facility].  Noralyn Pick, CRNP

## 2020-06-10 NOTE — Progress Notes (Signed)
I agree with the above note, plan 

## 2020-07-06 ENCOUNTER — Inpatient Hospital Stay (HOSPITAL_BASED_OUTPATIENT_CLINIC_OR_DEPARTMENT_OTHER)
Admission: EM | Admit: 2020-07-06 | Discharge: 2020-07-08 | DRG: 262 | Disposition: A | Discharge status: Home / Self Care | Payer: Medicare Other | Attending: Internal Medicine | Admitting: Internal Medicine

## 2020-07-06 ENCOUNTER — Emergency Department (HOSPITAL_BASED_OUTPATIENT_CLINIC_OR_DEPARTMENT_OTHER): Payer: Medicare Other

## 2020-07-06 ENCOUNTER — Other Ambulatory Visit: Payer: Self-pay

## 2020-07-06 ENCOUNTER — Encounter (HOSPITAL_COMMUNITY): Payer: Self-pay

## 2020-07-06 DIAGNOSIS — Z933 Colostomy status: Secondary | ICD-10-CM | POA: Diagnosis not present

## 2020-07-06 DIAGNOSIS — Z79899 Other long term (current) drug therapy: Secondary | ICD-10-CM | POA: Diagnosis not present

## 2020-07-06 DIAGNOSIS — Z87891 Personal history of nicotine dependence: Secondary | ICD-10-CM

## 2020-07-06 DIAGNOSIS — Z85048 Personal history of other malignant neoplasm of rectum, rectosigmoid junction, and anus: Secondary | ICD-10-CM

## 2020-07-06 DIAGNOSIS — C20 Malignant neoplasm of rectum: Secondary | ICD-10-CM | POA: Diagnosis not present

## 2020-07-06 DIAGNOSIS — R55 Syncope and collapse: Secondary | ICD-10-CM | POA: Diagnosis not present

## 2020-07-06 DIAGNOSIS — N400 Enlarged prostate without lower urinary tract symptoms: Secondary | ICD-10-CM | POA: Diagnosis present

## 2020-07-06 DIAGNOSIS — Z8616 Personal history of COVID-19: Secondary | ICD-10-CM | POA: Diagnosis not present

## 2020-07-06 DIAGNOSIS — Z803 Family history of malignant neoplasm of breast: Secondary | ICD-10-CM | POA: Diagnosis not present

## 2020-07-06 DIAGNOSIS — I495 Sick sinus syndrome: Principal | ICD-10-CM | POA: Diagnosis present

## 2020-07-06 DIAGNOSIS — E782 Mixed hyperlipidemia: Secondary | ICD-10-CM | POA: Diagnosis present

## 2020-07-06 DIAGNOSIS — R001 Bradycardia, unspecified: Secondary | ICD-10-CM | POA: Diagnosis present

## 2020-07-06 DIAGNOSIS — K219 Gastro-esophageal reflux disease without esophagitis: Secondary | ICD-10-CM | POA: Diagnosis not present

## 2020-07-06 DIAGNOSIS — G4733 Obstructive sleep apnea (adult) (pediatric): Secondary | ICD-10-CM | POA: Diagnosis present

## 2020-07-06 LAB — COMPREHENSIVE METABOLIC PANEL
ALT: 36 U/L (ref 0–44)
AST: 26 U/L (ref 15–41)
Albumin: 4.3 g/dL (ref 3.5–5.0)
Alkaline Phosphatase: 78 U/L (ref 38–126)
Anion gap: 8 (ref 5–15)
BUN: 21 mg/dL (ref 8–23)
CO2: 25 mmol/L (ref 22–32)
Calcium: 9.1 mg/dL (ref 8.9–10.3)
Chloride: 106 mmol/L (ref 98–111)
Creatinine, Ser: 1.2 mg/dL (ref 0.61–1.24)
GFR, Estimated: >60 mL/min (ref >60)
Glucose, Bld: 166 mg/dL — ABNORMAL HIGH (ref 70–99)
Potassium: 4 mmol/L (ref 3.5–5.1)
Sodium: 139 mmol/L (ref 135–145)
Total Bilirubin: 0.9 mg/dL (ref 0.3–1.2)
Total Protein: 7.1 g/dL (ref 6.5–8.1)

## 2020-07-06 LAB — URINALYSIS, ROUTINE W REFLEX MICROSCOPIC
Bilirubin Urine: NEGATIVE
Glucose, UA: NEGATIVE mg/dL
Hgb urine dipstick: NEGATIVE
Ketones, ur: NEGATIVE mg/dL
Leukocytes,Ua: NEGATIVE
Nitrite: NEGATIVE
Protein, ur: NEGATIVE mg/dL
Specific Gravity, Urine: 1.025 (ref 1.005–1.030)
pH: 7 (ref 5.0–8.0)

## 2020-07-06 LAB — RESP PANEL BY RT-PCR (FLU A&B, COVID) ARPGX2
Influenza A by PCR: NEGATIVE
Influenza B by PCR: NEGATIVE
SARS Coronavirus 2 by RT PCR: NEGATIVE

## 2020-07-06 LAB — CBC WITH DIFFERENTIAL/PLATELET
Abs Immature Granulocytes: 0.02 10*3/uL (ref 0.00–0.07)
Basophils Absolute: 0.1 10*3/uL (ref 0.0–0.1)
Basophils Relative: 1 %
Eosinophils Absolute: 0.1 10*3/uL (ref 0.0–0.5)
Eosinophils Relative: 1 %
HCT: 44.2 % (ref 39.0–52.0)
Hemoglobin: 15.2 g/dL (ref 13.0–17.0)
Immature Granulocytes: 0 %
Lymphocytes Relative: 24 %
Lymphs Abs: 1.9 10*3/uL (ref 0.7–4.0)
MCH: 31 pg (ref 26.0–34.0)
MCHC: 34.4 g/dL (ref 30.0–36.0)
MCV: 90 fL (ref 80.0–100.0)
Monocytes Absolute: 0.5 10*3/uL (ref 0.1–1.0)
Monocytes Relative: 6 %
Neutro Abs: 5.5 10*3/uL (ref 1.7–7.7)
Neutrophils Relative %: 68 %
Platelets: 207 10*3/uL (ref 150–400)
RBC: 4.91 MIL/uL (ref 4.22–5.81)
RDW: 13.1 % (ref 11.5–15.5)
WBC: 8.1 10*3/uL (ref 4.0–10.5)
nRBC: 0 % (ref 0.0–0.2)

## 2020-07-06 LAB — CBG MONITORING, ED: Glucose-Capillary: 163 mg/dL — ABNORMAL HIGH (ref 70–99)

## 2020-07-06 LAB — TROPONIN I (HIGH SENSITIVITY)
Troponin I (High Sensitivity): 4 ng/L (ref <18)
Troponin I (High Sensitivity): 5 ng/L (ref <18)

## 2020-07-06 LAB — BRAIN NATRIURETIC PEPTIDE: B Natriuretic Peptide: 14.6 pg/mL (ref 0.0–100.0)

## 2020-07-06 MED ORDER — ATORVASTATIN CALCIUM 10 MG PO TABS
20.0000 mg | ORAL_TABLET | Freq: Every day | ORAL | Status: DC
  Administered 2020-07-06 – 2020-07-07 (×2): 20 mg via ORAL
  Filled 2020-07-06 (×3): qty 2

## 2020-07-06 MED ORDER — ACETAMINOPHEN 650 MG RE SUPP: 650.0000 mg | Freq: Four times a day (QID) | RECTAL | Status: DC | PRN

## 2020-07-06 MED ORDER — SODIUM CHLORIDE 0.9% FLUSH: 3.0000 mL | INTRAVENOUS | Status: DC | PRN

## 2020-07-06 MED ORDER — ACETAMINOPHEN 325 MG PO TABS: 650.0000 mg | ORAL_TABLET | Freq: Four times a day (QID) | ORAL | Status: DC | PRN

## 2020-07-06 MED ORDER — POLYETHYLENE GLYCOL 3350 17 G PO PACK: 17.0000 g | PACK | Freq: Every day | ORAL | Status: DC | PRN

## 2020-07-06 MED ORDER — ENOXAPARIN SODIUM 40 MG/0.4ML ~~LOC~~ SOLN
40.0000 mg | SUBCUTANEOUS | Status: DC
  Administered 2020-07-06 – 2020-07-07 (×2): 40 mg via SUBCUTANEOUS
  Filled 2020-07-06 (×2): qty 0.4

## 2020-07-06 MED ORDER — SODIUM CHLORIDE 0.9 % IV BOLUS
500.0000 mL | Freq: Once | INTRAVENOUS | Status: AC
  Administered 2020-07-06: 500 mL via INTRAVENOUS

## 2020-07-06 MED ORDER — SODIUM CHLORIDE 0.9 % IV SOLN: 250.0000 mL | INTRAVENOUS | Status: DC | PRN

## 2020-07-06 MED ORDER — PANTOPRAZOLE SODIUM 40 MG PO TBEC
40.0000 mg | DELAYED_RELEASE_TABLET | Freq: Every evening | ORAL | Status: DC | PRN
Start: 2020-07-06 — End: 2020-07-08

## 2020-07-06 MED ORDER — SODIUM CHLORIDE 0.9% FLUSH
3.0000 mL | Freq: Two times a day (BID) | INTRAVENOUS | Status: DC
  Administered 2020-07-06 – 2020-07-07 (×3): 3 mL via INTRAVENOUS

## 2020-07-06 NOTE — Plan of Care (Signed)

## 2020-07-06 NOTE — ED Notes (Signed)
Attempted to give report, floor not answering phone at this time

## 2020-07-06 NOTE — ED Triage Notes (Signed)
Pt was sitting at desk when he had syncopal episode in chair. States does not remember, woke up with head on keyboard. Was sweating and wife states HR was reading 48 on BP monitor. Denies cardiac history. Denies chest pain. C/o headache and weakness.

## 2020-07-06 NOTE — H&P (Signed)
History and Physical:    [name]   EXH:[id number] DOB: [birth year] DOA: 07/06/2020  Referring MD/provider: PA Soto PCP: Merrilee Seashore, MD   Patient coming from: Home  Chief Complaint: Syncope x 3 today  History of Present Illness:   [name] is an 70 y.o. male with PMH significant for GERD, HL and vasovagal syncope when he sees blood was in his USO H until 7:00 this morning when he suddenly fainted while checking email.  Apparently he was checking email and the next thing he knew his head was on the keyboard pushing up against the computer.  Patient's wife states he had LOC for several seconds but not very long.  Patient had no confusion when he came to.  There is no evidence of seizures.  On route to ED, patient had another episode where he had syncope.  He apparently was staring straight ahead and was not responsive for several seconds and then he came to again.  Patient states that he had no symptoms prior to these episodes of syncope today.  In general he has been feeling well with no fevers or chills.  He during these episodes he had no chest pain or palpitations.  Notes that when he sees blood he knows that he is going to pass out and he had none of those symptoms today.  Is very surprised at what happened today.  ED Course:  The patient was being evaluated for new onset bradycardia and syncope when he suddenly had episode of syncope with heart rate down into the mid 30s.  Patient was [facility] to ED at [facility].  Cardiology fellow reviewed his EKGs and felt he was safe to be admitted to hospitalist service.  ROS:   ROS   Review of Systems: General: Denies fever, chills, malaise,  Endocrine: Denies heat/cold intolerance, polyuria or weight loss. Respiratory: Denies cough, SOB at rest or hemoptysis Cardiovascular: Denies chest pain or palpitations GI: Denies nausea, vomiting, diarrhea or constipation GU: Denies dysuria, frequency or hematuria CNS: Denies  HA, dizziness, confusion, new weakness or clumsiness. Blood/lymphatics: Denies easy bruising or bleeding Mood/affect: Denies anxiety/depression    Past Medical History:   Past Medical History:  Diagnosis Date  . Arthritis   . BPH (benign prostatic hyperplasia)   . Colon polyp   . HLD (hyperlipidemia)   . Obstructive sleep apnea 06/06/2020  . Rectal cancer ([place])   . Vasovagal syncope    dx years ago , reprots " now i sometimes pass out when i get my blood drawn, but now i just get sweaty"     Past Surgical History:   Past Surgical History:  Procedure Laterality Date  . COLONOSCOPY  2013  . INCISION AND DRAINAGE OF WOUND N/A 01/05/2019   Procedure: Excision of Peristomal skin mass;  Surgeon: Ileana Roup, MD;  Location: [facility];  Service: General;  Laterality: N/A;  . POLYPECTOMY  08-28-11   3 polyps  . RECTAL SURGERY     with colostomy    Social History:   Social History   Socioeconomic History  . Marital status: Married    Spouse name: Not on file  . Number of children: 1  . Years of education: `  . Highest education level: Not on file  Occupational History  . Occupation: Chief Financial Officer  Tobacco Use  . Smoking status: Former Research scientist (life sciences)  . Smokeless tobacco: Never Used  Vaping Use  . Vaping Use: Never used  Substance and Sexual  Activity  . Alcohol use: Yes    Alcohol/week: 1.0 standard drink    Types: 1 Standard drinks or equivalent per week    Comment: socially  . Drug use: No  . Sexual activity: Not on file  Other Topics Concern  . Not on file  Social History Narrative   2 CUPS OF COFFEE A DAY   Social Determinants of Health   Financial Resource Strain: Not on file  Food Insecurity: Not on file  Transportation Needs: Not on file  Physical Activity: Not on file  Stress: Not on file  Social Connections: Not on file  Intimate Partner Violence: Not on file    Allergies   Patient has no known allergies.  Family history:   Family History  Problem  Relation Age of Onset  . Ovarian cancer Mother   . Breast cancer Mother   . Hyperlipidemia Father        mother  . Stomach cancer Neg Hx   . Colon cancer Neg Hx   . Esophageal cancer Neg Hx   . Prostate cancer Neg Hx   . Rectal cancer Neg Hx     Current Medications:   Prior to Admission medications   Medication Sig Start Date End Date Taking? Authorizing Provider  atorvastatin (LIPITOR) 20 MG tablet Take 20 mg by mouth at bedtime.   Yes [provider]  Multiple Vitamin (MULTIVITAMIN) tablet Take 1 tablet by mouth daily.   Yes [provider]  pantoprazole (PROTONIX) 40 MG tablet Take 40 mg by mouth at bedtime as needed (for heartburn or reflux). 01/19/18  Yes [provider]  Probiotic Product (PROBIOTIC DAILY PO) Take 1 capsule by mouth daily.   Yes [provider]  SUPREP BOWEL PREP KIT 17.5-3.13-1.6 GM/177ML SOLN Take 1 kit by mouth as directed. For colonoscopy prep Patient not taking: No sig reported 06/07/20   Noralyn Pick, NP    Physical Exam:   Vitals:   07/06/20 1306 07/06/20 1400 07/06/20 1515 07/06/20 1516  BP: (!) 158/71 130/68 (!) 143/82 (!) 145/73  Pulse: (!) 43 (!) 40 (!) 55 (!) 58  Resp: $Remo'14 18 17 12  'tVPax$ Temp:   97.7 F (36.5 C) 98 F (36.7 C)  TempSrc:   Oral Oral  SpO2: 99% 100% 100% 99%  Weight:      Height:         Physical Exam: Blood pressure (!) 145/73, pulse (!) 58, temperature 98 F (36.7 C), temperature source Oral, resp. rate 12, height $RemoveBe'5\' 10"'MWSCIFpCB$  (1.778 m), weight 111.1 kg, SpO2 99 %. Gen: Well-appearing man in good spirits with attentive wife and son at bedside. Eyes: sclera anicteric, conjuctiva mildly injected bilaterally CVS: S1-S2, irregulary, no gallops Respiratory:  decreased air entry likely secondary to decreased inspiratory effort GI: NABS, soft, NT  LE: No edema. No cyanosis Neuro: A/O x 3, Moving all extremities equally with normal strength, CN 3-12 intact, grossly nonfocal.  Psych:  patient is logical and coherent, judgement and insight appear normal, mood and affect appropriate to situation. Skin: no rashes or lesions or ulcers,    Data Review:    Labs: Basic Metabolic Panel: Recent Labs  Lab 07/06/20 1028  NA 139  K 4.0  CL 106  CO2 25  GLUCOSE 166*  BUN 21  CREATININE 1.20  CALCIUM 9.1   Liver Function Tests: Recent Labs  Lab 07/06/20 1028  AST 26  ALT 36  ALKPHOS 78  BILITOT 0.9  PROT 7.1  ALBUMIN  4.3   No results for input(s): LIPASE, AMYLASE in the last 168 hours. No results for input(s): AMMONIA in the last 168 hours. CBC: Recent Labs  Lab 07/06/20 1028  WBC 8.1  NEUTROABS 5.5  HGB 15.2  HCT 44.2  MCV 90.0  PLT 207   Cardiac Enzymes: No results for input(s): CKTOTAL, CKMB, CKMBINDEX, TROPONINI in the last 168 hours.  BNP (last 3 results) No results for input(s): PROBNP in the last 8760 hours. CBG: Recent Labs  Lab 07/06/20 1031  GLUCAP 163*    Urinalysis    Component Value Date/Time   COLORURINE AMBER (A) 07/06/2020 1027   APPEARANCEUR CLEAR 07/06/2020 1027   LABSPEC 1.025 07/06/2020 1027   PHURINE 7.0 07/06/2020 1027   GLUCOSEU NEGATIVE 07/06/2020 1027   HGBUR NEGATIVE 07/06/2020 1027   BILIRUBINUR NEGATIVE 07/06/2020 1027   KETONESUR NEGATIVE 07/06/2020 1027   PROTEINUR NEGATIVE 07/06/2020 1027   NITRITE NEGATIVE 07/06/2020 1027   LEUKOCYTESUR NEGATIVE 07/06/2020 1027      Radiographic Studies: DG Chest 2 View  Result Date: 07/06/2020 CLINICAL DATA:  Syncope. EXAM: CHEST - 2 VIEW COMPARISON:  Chest CT December 06, 2019 FINDINGS: The heart size and mediastinal contours are within normal limits. Both lungs are clear. The visualized skeletal structures are unremarkable. IMPRESSION: No active cardiopulmonary disease. Electronically Signed   By: Abelardo Diesel M.D.   On: 07/06/2020 11:57    EKG: Independently reviewed.  Patient has first-degree heart block and bradycardia.  It is possible that he has more than 1  morphology of his P wave.  Ventricular rhythm is irregular.  No acute ST-T wave changes.  Q waves V1 through V3.   Assessment/Plan:   Principal Problem:   Syncope Active Problems:   Bradycardia with 41-50 beats per minute  Otherwise healthy 70 year old man with new onset bradycardia and syncope.  Symptomatic bradycardia with syncope DDx is unstable high degree block vs tachybradycardia syndrome. EKG was reviewed by cardiology fellow, patient is safe to be admitted to the hospital service. Will admit patient to progressive unit in case he does develop a high degree block. Cardiology to follow patient, will likely need to see EP.  GERD Continue PPI  HL Continue atorvastatin although patient states he is not completely compliant with this medication at home.   Other information:   DVT prophylaxis: Enoxaparin ordered. Code Status: Full Family Communication: Attentive wife and son were at bedside Disposition Plan: Home Consults called: Cardiology Admission status: Inpatient  [facility]  If 7PM-7AM, please contact night-coverage [email or web address] Password [email or web address] 07/06/2020, 7:00 PM

## 2020-07-06 NOTE — ED Notes (Signed)
Pt HR brady down to 30's became pale, wife states patient passed out, pt alert when this RN entered room. Placed on Zoll pads with repeat BP

## 2020-07-06 NOTE — ED Triage Notes (Signed)
Per [facility], From [facility], sinus brady with PACs, pt was diaphoretic and had several syncopal episode at home, at med center pt had runs of afib. Transferred for cardiology 20g in left ac, 20g in left hand, 500cc at med center.

## 2020-07-06 NOTE — ED Notes (Signed)
ED Provider at bedside. 

## 2020-07-06 NOTE — ED Notes (Signed)
Attempted to give report, floor is assigning the patient and not ready at this time.

## 2020-07-06 NOTE — ED Provider Notes (Addendum)
[facility] EMERGENCY DEPARTMENT Provider Note   CSN: [id number] Arrival date & time: 07/06/20  5176     History Chief Complaint  Patient presents with  . Loss of Consciousness    [name] is a 70 y.o. male.  70 y.o male with a PMH of BPH, obstructive sleep apnea, vasovagal syncope presents to the ED with a chief complaint of syncope x2.  Series obtained from spouse at the bedside, she reports patient was at his computer this morning, walked over to the couch after waking up, he slumped over, she had to shake him in order to get a response, he was very clammy and a looks like he had been out for a couple of seconds.  Second episode occurred when she was driving him to the ED, she states "he went out for a few seconds again ", got very clammy and cold.  Patient endorses a headache which began after this episode occurred, described this as posterior sharp throughout his whole head.  Also endorses dizziness, which is within his normal.  He feels an overall fatigue and weakness.  Patient did have COVID-19 in the month of February but recovered without any hospitalization.  He does report eating after the episodes occurred, states that he feels like "this did not sit well with him ".  Wife does note that patients blood pressure was normal while at home, however heart rate was very low around the 40 range.  There is no fever, nausea, chest pain, shortness of breath.  No prior history of MI, family history of cardiac disease.  He does not have any prior cardiology intervention such as stress test or echo.  The history is provided by the patient.  Loss of Consciousness Episode history:  Multiple Most recent episode:  Today Duration:  4 seconds Timing:  Intermittent Progression:  Worsening Chronicity:  New Context: normal activity   Context: not urination   Ineffective treatments:  None tried Associated symptoms: diaphoresis, dizziness, headaches and weakness   Associated symptoms:  no chest pain, no fever, no nausea, no shortness of breath and no vomiting        Past Medical History:  Diagnosis Date  . Arthritis   . BPH (benign prostatic hyperplasia)   . Colon polyp   . HLD (hyperlipidemia)   . Obstructive sleep apnea 06/06/2020  . Rectal cancer ([place])   . Vasovagal syncope    dx years ago , reprots " now i sometimes pass out when i get my blood drawn, but now i just get sweaty"     Patient Active Problem List   Diagnosis Date Noted  . Syncope 07/06/2020  . 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 Obstructive sleep apnea 06/06/2020  . Mixed hyperlipidemia 06/06/2020  . Impaired fasting glucose 06/06/2020  . Benign prostatic hyperplasia without lower urinary tract symptoms 06/06/2020  . Asthma without status asthmaticus 06/06/2020  . Laryngopharyngeal reflux (LPR) 01/31/2018  . History of colonic polyps 08/24/2014  . Rectal cancer ([place]) 01/26/2011    Past Surgical History:  Procedure Laterality Date  . COLONOSCOPY  2013  . INCISION AND DRAINAGE OF WOUND N/A 01/05/2019   Procedure: Excision of Peristomal skin mass;  Surgeon: Ileana Roup, MD;  Location: [facility];  Service: General;  Laterality: N/A;  . POLYPECTOMY  08-28-11   3 polyps  . RECTAL SURGERY     with colostomy       Family History  Problem Relation Age of Onset  . Ovarian  cancer Mother   . Breast cancer Mother   . Hyperlipidemia Father        mother  . Stomach cancer Neg Hx   . Colon cancer Neg Hx   . Esophageal cancer Neg Hx   . Prostate cancer Neg Hx   . Rectal cancer Neg Hx     Social History   Tobacco Use  . Smoking status: Former Research scientist (life sciences)  . Smokeless tobacco: Never Used  Vaping Use  . Vaping Use: Never used  Substance Use Topics  . Alcohol use: Yes    Alcohol/week: 1.0 standard drink    Types: 1 Standard drinks or equivalent per week    Comment: socially  . Drug use: No    Home Medications Prior to Admission medications   Medication Sig Start Date End Date  Taking? Authorizing Provider  atorvastatin (LIPITOR) 20 MG tablet Take 20 mg by mouth daily.    [provider]  Multiple Vitamin (MULTIVITAMIN) tablet Take 1 tablet by mouth daily.    [provider]  pantoprazole (PROTONIX) 40 MG tablet Take 1 tablet by mouth daily. 01/19/18   [provider]  Probiotic Product (PROBIOTIC DAILY PO) Take 1 capsule by mouth daily.    [provider]  SUPREP BOWEL PREP KIT 17.5-3.13-1.6 GM/177ML SOLN Take 1 kit by mouth as directed. For colonoscopy prep 06/07/20   Noralyn Pick, NP  vitamin C (ASCORBIC ACID) 500 MG tablet Take 500 mg by mouth daily.    [provider]    Allergies    Patient has no known allergies.  Review of Systems   Review of Systems  Constitutional: Positive for diaphoresis. Negative for fever.  HENT: Negative for sore throat.   Eyes: Negative for photophobia.  Respiratory: Negative for shortness of breath.   Cardiovascular: Positive for syncope. Negative for chest pain.  Gastrointestinal: Negative for abdominal pain, diarrhea, nausea and vomiting.  Genitourinary: Negative for flank pain.  Musculoskeletal: Negative for back pain.  Skin: Negative for pallor and wound.  Neurological: Positive for dizziness, syncope, weakness and headaches. Negative for speech difficulty, light-headedness and numbness.  All other systems reviewed and are negative.   Physical Exam Updated Vital Signs BP (!) 158/71   Pulse (!) 43   Temp 97.7 F (36.5 C) (Oral)   Resp 14   Ht $R'5\' 10"'vR$  (1.778 m)   Wt 111.1 kg   SpO2 99%   BMI 35.15 kg/m   Physical Exam Vitals and nursing note reviewed.  Constitutional:      Appearance: Normal appearance.  HENT:     Head: Normocephalic and atraumatic.     Nose: Nose normal.     Mouth/Throat:     Mouth: Mucous membranes are moist.  Eyes:     Pupils: Pupils are equal, round, and reactive to light.  Cardiovascular:     Rate and Rhythm: Bradycardia  present.  Pulmonary:     Effort: Pulmonary effort is normal.     Breath sounds: No wheezing or rales.  Abdominal:     General: Abdomen is flat.     Tenderness: There is no abdominal tenderness. There is no right CVA tenderness or left CVA tenderness.  Musculoskeletal:     Cervical back: Normal range of motion and neck supple.  Skin:    General: Skin is warm and dry.  Neurological:     Mental Status: He is alert and oriented to person, place, and time.     Comments: Alert, oriented, thought content  appropriate. Speech fluent without evidence of aphasia. Able to follow 2 step commands without difficulty.  Cranial Nerves:  II:  Peripheral visual fields grossly normal, pupils, round, reactive to light III,IV, VI: ptosis not present, extra-ocular motions intact bilaterally  V,VII: smile symmetric, facial light touch sensation equal VIII: hearing grossly normal bilaterally  IX,X: midline uvula rise  XI: bilateral shoulder shrug equal and strong XII: midline tongue extension  Motor:  5/5 in upper and lower extremities bilaterally including strong and equal grip strength and dorsiflexion/plantar flexion Sensory: light touch normal in all extremities.  Cerebellar: normal finger-to-nose with bilateral upper extremities, pronator drift negative      ED Results / Procedures / Treatments   Labs (all labs ordered are listed, but only abnormal results are displayed) Labs Reviewed  COMPREHENSIVE METABOLIC PANEL - Abnormal; Notable for the following components:      Result Value   Glucose, Bld 166 (*)    All other components within normal limits  URINALYSIS, ROUTINE W REFLEX MICROSCOPIC - Abnormal; Notable for the following components:   Color, Urine AMBER (*)    All other components within normal limits  CBG MONITORING, ED - Abnormal; Notable for the following components:   Glucose-Capillary 163 (*)    All other components within normal limits  RESP PANEL BY RT-PCR (FLU A&B, COVID) [id number]   CBC WITH DIFFERENTIAL/PLATELET  BRAIN NATRIURETIC PEPTIDE  TROPONIN I (HIGH SENSITIVITY)  TROPONIN I (HIGH SENSITIVITY)    EKG EKG Interpretation  Date/Time:  Saturday July 06 2020 10:09:31 EDT Ventricular Rate:  55 PR Interval:    QRS Duration: 99 QT Interval:  462 QTC Calculation: 442 R Axis:   2 Text Interpretation: Sinus rhythm Atrial premature complex Prolonged PR interval Low voltage, precordial leads Probable anteroseptal infarct, old PACs new from previous, otherwise similar Confirmed by Theotis Burrow [phone]) on 07/06/2020 10:18:41 AM   Radiology DG Chest 2 View  Result Date: 07/06/2020 CLINICAL DATA:  Syncope. EXAM: CHEST - 2 VIEW COMPARISON:  Chest CT December 06, 2019 FINDINGS: The heart size and mediastinal contours are within normal limits. Both lungs are clear. The visualized skeletal structures are unremarkable. IMPRESSION: No active cardiopulmonary disease. Electronically Signed   By: Abelardo Diesel M.D.   On: 07/06/2020 11:57    Procedures Procedures   Medications Ordered in ED Medications  sodium chloride 0.9 % bolus 500 mL ( Intravenous Stopped 07/06/20 1202)    ED Course  I have reviewed the triage vital signs and the nursing notes.  Pertinent labs & imaging results that were available during my care of the patient were reviewed by me and considered in my medical decision making (see chart for details).  Clinical Course as of 07/06/20 1337  Sat Jul 06, 2020  1050 Pulse Rate(!): 44 [JS]    Clinical Course User Index [JS] Janeece Fitting, PA-C   MDM Rules/Calculators/A&P     Patient with a past medical history of BPH, colon cancer currently in remission with a colostomy bag presents to the ED with a chief complaint of syncopal episodes x2.  These were both witnessed by wife, reports he was out for a couple of seconds, became clammy then developed a posterior headache.  Wife reports taking his vital signs at home which included a normotensive pressure but  heart rate remarkable for rate in the low 40s.  He does not have any prior cardiac disease, family history of cardiac disease, or history of bradycardia.  He is currently not on any medications  for rate control.  Patient did have COVID-19 in the month of February, reports recovering at home.  Extensive review from prior PCP visits along with gastroenterology, patient's heart rate usually runs around the 70 range.  On arrival today, EKG remarkable for heart rate of 55.  He reports feeling overall fatigued and weak, some suspicion for symptomatic bradycardia.  Has lungs are clear to auscultation, abdomen is soft nontender to palpation.  He is neurologically intact moves all upper and lower extremities without any dysarthria or facial asymmetry.  CBG on arrival at 163.  Interpretations of his labs including a CBC without any leukocytosis, hemoglobin is stable.  CMP without any electrolyte derangement, glucose slightly elevated on today's visit, no prior history of diabetes.  Creatinine level is unremarkable.  LFTs are normal.  First troponin is negative, BNP obtained which was normal on today's visit.  He was swab for COVID-19 which was all negative.  [place] Syncope rule patient will likely need admission for further management.  Call placed to cardiology in order to obtain further recommendations for symptomatic bradycardia.  I will place to hospitalist in order to admit patient for further management.  He was given a 500 IV bolus, he remains hemodynamically stable.  Spoke to Dr. Jamse Arn who will admit patient to [facility] for observation, cardiology consult is still pending, however cardiology has not called back.  We will also obtain a repeat EKG now that patient is at a regular rhythm.  Spoke Dr. Lovena Le who will evaluate patient while in the ED.   Portions of this note were generated with [employer]. Dictation errors may occur despite best attempts at proofreading.  Final  Clinical Impression(s) / ED Diagnoses Final diagnoses:  Syncope, unspecified syncope type    Rx / DC Orders ED Discharge Orders    None       Janeece Fitting, PA-C 07/06/20 1228    Janeece Fitting, PA-C 07/06/20 1337    Little, Wenda Overland, MD 07/06/20 1525

## 2020-07-06 NOTE — Consult Note (Signed)
Cardiology Consultation:   Patient ID: [name] MRN: [id number]; DOB: 1951-04-09  Admit date: 07/06/2020 Date of Consult: 07/06/2020  Primary Care Provider: Georgianne Fick, MD Primary Cardiologist: No primary care provider on file.  Primary Electrophysiologist:  None    Patient Profile:   [name] is a 70 y.o. male with a hx of GERD, HLD, rectal cancer s/p ostomy who is being seen today for the evaluation of syncope.  History of Present Illness:   [name] states that he was feeling perfectly well and normal upon waking up this morning around 7 AM.  He went upstairs and sat down in front of his computer to check his email.  The next thing he knew he woke up with his face landing on his computer keyboard.  He has no idea how long he had been out for, but after discussion with his wife it appears as though it was only a few seconds.  He stood up after this and felt a bit dizzy and tired.  His wife checked his heart rate, which was noted to be in the 40s.  He has never had any episodes of syncope or seizure before.  After the episode this morning, he had at least 2 more episodes of witnessed syncope today.  One was witnessed while he was with EMS on his way to the emergency department.  His heart rate was noted to be in the 30s around this time and his symptoms lasted again for only a few seconds.  The patient denies any recent changes to his health.  He has had no changes to his medications.  He does not use illicit substances, does not smoke cigarettes, and drinks less than 1 drink of alcohol per month.  He lives a relatively sedentary lifestyle but has no issues with exertional chest pain or dyspnea.  His father had a pacemaker in his 65s but otherwise the patient does not know of any other family history of sudden cardiac death or other cardiovascular disease.  In the emergency department, the patient was again noted to have heart rates into the 30s.  His troponin was negative and  the remainder of his labs were unremarkable.  A chest x-ray was normal.  2 ECGs obtained in the emergency department revealed sinus bradycardia with occasional PACs and heart rates in the 40s to 50s.  The patient was admitted to the hospitalist service for further management.  At the time of my discussion with the patient.  He states he feels completely normal and asymptomatic.  Heart Pathway Score:     Past Medical History:  Diagnosis Date  . Arthritis   . BPH (benign prostatic hyperplasia)   . Colon polyp   . HLD (hyperlipidemia)   . Obstructive sleep apnea 06/06/2020  . Rectal cancer (HCC)   . Vasovagal syncope    dx years ago , reprots " now i sometimes pass out when i get my blood drawn, but now i just get sweaty"     Past Surgical History:  Procedure Laterality Date  . COLONOSCOPY  2013  . INCISION AND DRAINAGE OF WOUND N/A 01/05/2019   Procedure: Excision of Peristomal skin mass;  Surgeon: Andria Meuse, MD;  Location: [facility];  Service: General;  Laterality: N/A;  . POLYPECTOMY  08-28-11   3 polyps  . RECTAL SURGERY     with colostomy     Home Medications:  Prior to Admission medications   Medication Sig Start Date End Date  Taking? Authorizing Provider  atorvastatin (LIPITOR) 20 MG tablet Take 20 mg by mouth at bedtime.   Yes [provider]  Multiple Vitamin (MULTIVITAMIN) tablet Take 1 tablet by mouth daily.   Yes [provider]  pantoprazole (PROTONIX) 40 MG tablet Take 40 mg by mouth at bedtime as needed (for heartburn or reflux). 01/19/18  Yes [provider]  Probiotic Product (PROBIOTIC DAILY PO) Take 1 capsule by mouth daily.   Yes [provider]  SUPREP BOWEL PREP KIT 17.5-3.13-1.6 GM/177ML SOLN Take 1 kit by mouth as directed. For colonoscopy prep Patient not taking: No sig reported 06/07/20   Noralyn Pick, NP    Inpatient Medications: Scheduled Meds: . atorvastatin  20 mg Oral QHS  . enoxaparin (LOVENOX)  injection  40 mg Subcutaneous Q24H  . sodium chloride flush  3 mL Intravenous Q12H   Continuous Infusions: . sodium chloride     PRN Meds: sodium chloride, acetaminophen **OR** acetaminophen, pantoprazole, polyethylene glycol, sodium chloride flush  Allergies:   No Known Allergies  Social History:   Social History   Socioeconomic History  . Marital status: Married    Spouse name: Not on file  . Number of children: 1  . Years of education: `  . Highest education level: Not on file  Occupational History  . Occupation: Chief Financial Officer  Tobacco Use  . Smoking status: Former Research scientist (life sciences)  . Smokeless tobacco: Never Used  Vaping Use  . Vaping Use: Never used  Substance and Sexual Activity  . Alcohol use: Yes    Alcohol/week: 1.0 standard drink    Types: 1 Standard drinks or equivalent per week    Comment: socially  . Drug use: No  . Sexual activity: Not on file  Other Topics Concern  . Not on file  Social History Narrative   2 CUPS OF COFFEE A DAY   Social Determinants of Health   Financial Resource Strain: Not on file  Food Insecurity: Not on file  Transportation Needs: Not on file  Physical Activity: Not on file  Stress: Not on file  Social Connections: Not on file  Intimate Partner Violence: Not on file    Family History:    Family History  Problem Relation Age of Onset  . Ovarian cancer Mother   . Breast cancer Mother   . Hyperlipidemia Father        mother  . Stomach cancer Neg Hx   . Colon cancer Neg Hx   . Esophageal cancer Neg Hx   . Prostate cancer Neg Hx   . Rectal cancer Neg Hx      ROS:  Please see the history of present illness.   All other ROS reviewed and negative.     Physical Exam/Data:   Vitals:   07/06/20 2000 07/06/20 2115 07/06/20 2149 07/06/20 2150  BP:  124/64  (!) 159/85  Pulse: 70 62  (!) 55  Resp: $Remo'15 16 15   'vVFPo$ Temp:   98.3 F (36.8 C)   TempSrc:   Oral   SpO2: 98% 98%  100%  Weight:   110 kg   Height:   '5\' 10"'$  (1.778 m)      Intake/Output Summary (Last 24 hours) at 07/06/2020 2215 Last data filed at 07/06/2020 1306 Gross per 24 hour  Intake 500.94 ml  Output --  Net 500.94 ml   Last 3 Weights 07/06/2020 07/06/2020 06/07/2020  Weight (lbs) 242 lb 8.1 oz 245 lb 248 lb  Weight (kg) 110 kg 111.131  kg 112.492 kg     Body mass index is 34.8 kg/m.  General:  Well nourished, well developed, in no acute distress HEENT: normal Neck: no JVD Endocrine:  No thryomegaly Cardiac:  Bradycardic, normal S1, S2; no murmur  Lungs:  clear to auscultation bilaterally, no wheezing, rhonchi or rales  Abd: soft, nontender, no hepatomegaly  Ext: no edema  EKG:  The EKG was personally reviewed and demonstrates:  Sinus bradycardia, frequent PACs with blocked sinus beats, no ST/T wave abnormalities Telemetry:  Telemetry was personally reviewed and demonstrates:  Resting sinus bradycardia with occasional PACs and transient resultant HR's in the 40's  Relevant CV Studies: None  Laboratory Data:  High Sensitivity Troponin:   Recent Labs  Lab 07/06/20 1028 07/06/20 1307  TROPONINIHS 4 5     Chemistry Recent Labs  Lab 07/06/20 1028  NA 139  K 4.0  CL 106  CO2 25  GLUCOSE 166*  BUN 21  CREATININE 1.20  CALCIUM 9.1  GFRNONAA >60  ANIONGAP 8    Recent Labs  Lab 07/06/20 1028  PROT 7.1  ALBUMIN 4.3  AST 26  ALT 36  ALKPHOS 78  BILITOT 0.9   Hematology Recent Labs  Lab 07/06/20 1028  WBC 8.1  RBC 4.91  HGB 15.2  HCT 44.2  MCV 90.0  MCH 31.0  MCHC 34.4  RDW 13.1  PLT 207   BNP Recent Labs  Lab 07/06/20 1028  BNP 14.6    DDimer No results for input(s): DDIMER in the last 168 hours.   Radiology/Studies:  DG Chest 2 View  Result Date: 07/06/2020 CLINICAL DATA:  Syncope. EXAM: CHEST - 2 VIEW COMPARISON:  Chest CT December 06, 2019 FINDINGS: The heart size and mediastinal contours are within normal limits. Both lungs are clear. The visualized skeletal structures are unremarkable. IMPRESSION: No  active cardiopulmonary disease. Electronically Signed   By: Abelardo Diesel M.D.   On: 07/06/2020 11:57    Assessment and Plan:  [name] is a 70 y.o. male with a hx of GERD, HLD, rectal cancer s/p ostomy who is being seen today for the evaluation of syncope.  Review of the available telemetry and ECG data reveals frequent premature atrial beats that appear to block sinus beats and results in bradycardia with heart rates in the 40s.  I had a long conversation with the patient while these episodes were occurring and he was notably asymptomatic throughout.  Furthermore the patient states that his resting heart rate has been in the 50s for as long as he can remember and he has never been symptomatic before today.  It therefore seems unlikely that transient heart rates in the 40s would be responsible for true syncopal episodes.  Rather it seems more likely that he had a more severe bradycardic episode either from a sick sinus syndrome or a long pause that resulted in the syncopal episodes from today.  In any case, there does not appear to be any clear reversible cause for the patient's bradycardia and syncope.  In this case, a pacemaker would be warranted to prevent future episodes of syncope.  The patient is hesitant to proceed with pacemaker implantation, however he understands the potential danger associated with unprotected episodes of arrhythmia.  - EP consult in AM - please obtain echo in AM - please check TSH - ok to continue home atorvastatin    For questions or updates, please contact [place] Please consult [email or web address] for contact info under    Signed,  Marcie Mowers, MD  07/06/2020 10:15 PM

## 2020-07-06 NOTE — ED Notes (Signed)
Admitting at bedside 

## 2020-07-07 ENCOUNTER — Inpatient Hospital Stay (HOSPITAL_COMMUNITY): Payer: Medicare Other

## 2020-07-07 DIAGNOSIS — Z933 Colostomy status: Secondary | ICD-10-CM

## 2020-07-07 DIAGNOSIS — C20 Malignant neoplasm of rectum: Secondary | ICD-10-CM

## 2020-07-07 DIAGNOSIS — N400 Enlarged prostate without lower urinary tract symptoms: Secondary | ICD-10-CM | POA: Diagnosis not present

## 2020-07-07 DIAGNOSIS — R001 Bradycardia, unspecified: Secondary | ICD-10-CM | POA: Diagnosis not present

## 2020-07-07 DIAGNOSIS — R55 Syncope and collapse: Secondary | ICD-10-CM

## 2020-07-07 DIAGNOSIS — K219 Gastro-esophageal reflux disease without esophagitis: Secondary | ICD-10-CM

## 2020-07-07 DIAGNOSIS — G4733 Obstructive sleep apnea (adult) (pediatric): Secondary | ICD-10-CM

## 2020-07-07 DIAGNOSIS — E782 Mixed hyperlipidemia: Secondary | ICD-10-CM

## 2020-07-07 LAB — BASIC METABOLIC PANEL
Anion gap: 7 (ref 5–15)
BUN: 18 mg/dL (ref 8–23)
CO2: 23 mmol/L (ref 22–32)
Calcium: 8.9 mg/dL (ref 8.9–10.3)
Chloride: 107 mmol/L (ref 98–111)
Creatinine, Ser: 1.13 mg/dL (ref 0.61–1.24)
GFR, Estimated: >60 mL/min (ref >60)
Glucose, Bld: 80 mg/dL (ref 70–99)
Potassium: 3.9 mmol/L (ref 3.5–5.1)
Sodium: 137 mmol/L (ref 135–145)

## 2020-07-07 LAB — MRSA PCR SCREENING: MRSA by PCR: NEGATIVE

## 2020-07-07 LAB — ECHOCARDIOGRAM COMPLETE
Area-P 1/2: 2.48 cm2
Height: 70 in
S' Lateral: 2.7 cm
Weight: 3880.1 oz

## 2020-07-07 LAB — TSH: TSH: 2.148 u[IU]/mL (ref 0.350–4.500)

## 2020-07-07 MED ORDER — HYDRALAZINE HCL 20 MG/ML IJ SOLN: 10.0000 mg | INTRAMUSCULAR | Status: DC | PRN

## 2020-07-07 MED ORDER — DM-GUAIFENESIN ER 30-600 MG PO TB12: 1.0000 | ORAL_TABLET | Freq: Two times a day (BID) | ORAL | Status: DC | PRN

## 2020-07-07 MED ORDER — SENNOSIDES-DOCUSATE SODIUM 8.6-50 MG PO TABS: 1.0000 | ORAL_TABLET | Freq: Every evening | ORAL | Status: DC | PRN

## 2020-07-07 NOTE — Plan of Care (Signed)
  Problem: Education: Goal: Knowledge of General Education information will improve Description: Including pain rating scale, medication(s)/side effects and non-pharmacologic comfort measures Outcome: Progressing   Problem: Coping: Goal: Level of anxiety will decrease Outcome: Progressing   Problem: Safety: Goal: Ability to remain free from injury will improve Outcome: Progressing   

## 2020-07-07 NOTE — Progress Notes (Signed)
PROGRESS NOTE    [name]  CXK:[id number] DOB: June 27, 1950 DOA: 07/06/2020 PCP: Merrilee Seashore, MD   Brief Narrative:  70 year old with history of GERD, hyperlipidemia, rectal cancer status post ostomy presented with symptomatic bradycardia and syncope.  Upon admission noted to be heart rate in 30s, troponin and routine lab work are unremarkable.  Chest x-ray negative.  Cardiology team consulted.   Assessment & Plan:   Principal Problem:   Syncope Active Problems:   Rectal cancer (HCC)   Laryngopharyngeal reflux (LPR)   Colostomy present (HCC)   Obstructive sleep apnea   Mixed hyperlipidemia   Benign prostatic hyperplasia without lower urinary tract symptoms   Bradycardia with 41-50 beats per minute  Symptomatic bradycardia with syncope -Concerns for sick sinus syndrome with long pause? Vs vasovagal. Hx of Lyme disease and covid. This could have led to conduction abnormality.  -Cardiology and EP team consulted-likely may need pacemaker vs loop recorder.  -Echocardiogram -TSH-normal -Continue telemetry  GERD -PPI  Hyperlipidemia -Statin  History of rectal cancer-adenocarcinoma -Status post ostomy 2020  Obstructive sleep apnea -Bedtime CPAP.  Wonder if this can be contributing to his bradycardia episodes   DVT prophylaxis: Lovenox Code Status: Full code Family Communication:  Wife and daughter at Bedside.   Status is: Inpatient  Remains inpatient appropriate because:Inpatient level of care appropriate due to severity of illness   Dispo: The patient is from: Home              Anticipated d/c is to: Home              Patient currently is not medically stable to d/c.  Needs thorough cardiac evaluation by EP team due to symptomatic bradycardia.   Difficult to place patient No     Subjective: Doing ok this morning no complaints.   Denies having such issue in the past. Had Lyme's disease many years ago, multiple times throught his life. Had covid about a  month ago.   Review of Systems Otherwise negative except as per HPI, including: General: Denies fever, chills, night sweats or unintended weight loss. Resp: Denies cough, wheezing, shortness of breath. Cardiac: Denies chest pain, palpitations, orthopnea, paroxysmal nocturnal dyspnea. GI: Denies abdominal pain, nausea, vomiting, diarrhea or constipation GU: Denies dysuria, frequency, hesitancy or incontinence MS: Denies muscle aches, joint pain or swelling Neuro: Denies headache, neurologic deficits (focal weakness, numbness, tingling), abnormal gait Psych: Denies anxiety, depression, SI/HI/AVH Skin: Denies new rashes or lesions ID: Denies sick contacts, exotic exposures, travel  Examination:  General exam: Appears calm and comfortable  Respiratory system: Clear to auscultation. Respiratory effort normal. Cardiovascular system: S1 & S2 heard, RRR. No JVD, murmurs, rubs, gallops or clicks. No pedal edema. Gastrointestinal system: Abdomen is nondistended, soft and nontender. No organomegaly or masses felt. Normal bowel sounds heard. Central nervous system: Alert and oriented. No focal neurological deficits. Extremities: Symmetric 5 x 5 power. Skin: No rashes, lesions or ulcers Psychiatry: Judgement and insight appear normal. Mood & affect appropriate.     Objective: Vitals:   07/06/20 2149 07/06/20 2150 07/06/20 2300 07/07/20 0347  BP:  (!) 159/85 127/67 112/65  Pulse:  (!) 55 (!) 51 (!) 56  Resp: 15  15 17   Temp: 98.3 F (36.8 C)  98.6 F (37 C) 98.2 F (36.8 C)  TempSrc: Oral  Oral Oral  SpO2:  100% 97% 93%  Weight: 110 kg     Height: 5\' 10"  (1.778 m)       Intake/Output Summary (Last  24 hours) at 07/07/2020 0716 Last data filed at 07/07/2020 0415 Gross per 24 hour  Intake 500.94 ml  Output 550 ml  Net -49.06 ml   Filed Weights   07/06/20 1011 07/06/20 2149  Weight: 111.1 kg 110 kg     Data Reviewed:   CBC: Recent Labs  Lab 07/06/20 1028  WBC 8.1   NEUTROABS 5.5  HGB 15.2  HCT 44.2  MCV 90.0  PLT 035   Basic Metabolic Panel: Recent Labs  Lab 07/06/20 1028 07/07/20 0115  NA 139 137  K 4.0 3.9  CL 106 107  CO2 25 23  GLUCOSE 166* 80  BUN 21 18  CREATININE 1.20 1.13  CALCIUM 9.1 8.9   GFR: Estimated Creatinine Clearance: 74.5 mL/min (by C-G formula based on SCr of 1.13 mg/dL). Liver Function Tests: Recent Labs  Lab 07/06/20 1028  AST 26  ALT 36  ALKPHOS 78  BILITOT 0.9  PROT 7.1  ALBUMIN 4.3   No results for input(s): LIPASE, AMYLASE in the last 168 hours. No results for input(s): AMMONIA in the last 168 hours. Coagulation Profile: No results for input(s): INR, PROTIME in the last 168 hours. Cardiac Enzymes: No results for input(s): CKTOTAL, CKMB, CKMBINDEX, TROPONINI in the last 168 hours. BNP (last 3 results) No results for input(s): PROBNP in the last 8760 hours. HbA1C: No results for input(s): HGBA1C in the last 72 hours. CBG: Recent Labs  Lab 07/06/20 1031  GLUCAP 163*   Lipid Profile: No results for input(s): CHOL, HDL, LDLCALC, TRIG, CHOLHDL, LDLDIRECT in the last 72 hours. Thyroid Function Tests: Recent Labs    07/07/20 0115  TSH 2.148   Anemia Panel: No results for input(s): VITAMINB12, FOLATE, FERRITIN, TIBC, IRON, RETICCTPCT in the last 72 hours. Sepsis Labs: No results for input(s): PROCALCITON, LATICACIDVEN in the last 168 hours.  Recent Results (from the past 240 hour(s))  Resp Panel by RT-PCR (Flu A&B, Covid) Nasopharyngeal Swab     Status: None   Collection Time: 07/06/20 11:01 AM   Specimen: Nasopharyngeal Swab; Nasopharyngeal(NP) swabs in vial transport medium  Result Value Ref Range Status   SARS Coronavirus 2 by RT PCR NEGATIVE NEGATIVE Final    Comment: (NOTE) SARS-CoV-2 target nucleic acids are NOT DETECTED.  The SARS-CoV-2 RNA is generally detectable in upper respiratory specimens during the acute phase of infection. The lowest concentration of SARS-CoV-2 viral  copies this assay can detect is 138 copies/mL. A negative result does not preclude SARS-Cov-2 infection and should not be used as the sole basis for treatment or other patient management decisions. A negative result may occur with  improper specimen collection/handling, submission of specimen other than nasopharyngeal swab, presence of viral mutation(s) within the areas targeted by this assay, and inadequate number of viral copies(<138 copies/mL). A negative result must be combined with clinical observations, patient history, and epidemiological information. The expected result is Negative.  Fact Sheet for Patients:  EntrepreneurPulse.com.au  Fact Sheet for Healthcare Providers:  IncredibleEmployment.be  This test is no t yet approved or cleared by the [place] FDA and  has been authorized for detection and/or diagnosis of SARS-CoV-2 by FDA under an Emergency Use Authorization (EUA). This EUA will remain  in effect (meaning this test can be used) for the duration of the COVID-19 declaration under Section 564(b)(1) of the Act, 21 U.S.C.section 360bbb-3(b)(1), unless the authorization is terminated  or revoked sooner.       Influenza A by PCR NEGATIVE NEGATIVE Final   Influenza  B by PCR NEGATIVE NEGATIVE Final    Comment: (NOTE) The Xpert Xpress SARS-CoV-2/FLU/RSV plus assay is intended as an aid in the diagnosis of influenza from Nasopharyngeal swab specimens and should not be used as a sole basis for treatment. Nasal washings and aspirates are unacceptable for Xpert Xpress SARS-CoV-2/FLU/RSV testing.  Fact Sheet for Patients: EntrepreneurPulse.com.au  Fact Sheet for Healthcare Providers: IncredibleEmployment.be  This test is not yet approved or cleared by the [place] FDA and has been authorized for detection and/or diagnosis of SARS-CoV-2 by FDA under an Emergency Use Authorization (EUA). This  EUA will remain in effect (meaning this test can be used) for the duration of the COVID-19 declaration under Section 564(b)(1) of the Act, 21 U.S.C. section 360bbb-3(b)(1), unless the authorization is terminated or revoked.  Performed at [facility], [address] [place], [place] [address]   MRSA PCR Screening     Status: None   Collection Time: 07/06/20 10:16 PM   Specimen: Nasal Mucosa; Nasopharyngeal  Result Value Ref Range Status   MRSA by PCR NEGATIVE NEGATIVE Final    Comment:        The GeneXpert MRSA Assay (FDA approved for NASAL specimens only), is one component of a comprehensive MRSA colonization surveillance program. It is not intended to diagnose MRSA infection nor to guide or monitor treatment for MRSA infections. Performed at [facility], [place] [address], [place], [place] [address]          Radiology Studies: DG Chest 2 View  Result Date: 07/06/2020 CLINICAL DATA:  Syncope. EXAM: CHEST - 2 VIEW COMPARISON:  Chest CT December 06, 2019 FINDINGS: The heart size and mediastinal contours are within normal limits. Both lungs are clear. The visualized skeletal structures are unremarkable. IMPRESSION: No active cardiopulmonary disease. Electronically Signed   By: Abelardo Diesel M.D.   On: 07/06/2020 11:57        Scheduled Meds: . atorvastatin  20 mg Oral QHS  . enoxaparin (LOVENOX) injection  40 mg Subcutaneous Q24H  . sodium chloride flush  3 mL Intravenous Q12H   Continuous Infusions: . sodium chloride       LOS: 1 day   Time spent= 35 mins    Jayro Mcmath Arsenio Loader, MD [facility]  If 7PM-7AM, please contact night-coverage  07/07/2020, 7:16 AM

## 2020-07-07 NOTE — Consult Note (Signed)
Cardiology Consultation:   Patient ID: [name] MRN: [id number]; DOB: 1950/09/02  Admit date: 07/06/2020 Date of Consult: 07/07/2020  PCP:  Merrilee Seashore, [place]  Cardiologist:  No primary care provider on file.  Advanced Practice Provider:  No care team member to display Electrophysiologist:  None      Patient Profile:   [name] is a 70 y.o. male with a hx of syncope who is being seen today for the evaluation of syncope and sinus bradycardia at the request of Dr Reesa Chew.  History of Present Illness:   [name] is a very pleasant 70 year old man who has a remote history of passing out when he gave blood.  He was in his usual state of health at the computer yesterday when without warning he passed out.  On awakening he felt diaphoretic and pale and clammy.  He was taken by his wife to the emergency room.  She notes that he passed out while in the car.  In the emergency room, he was also noted to have a third episode of syncope associated with heart rates in the 30s.  He was on telemetry at the time.  The patient did not have incontinence.  Following the episodes, he felt incoherent for a few minutes but then went back to his normal state.  He denies chest pain or shortness of breath but does note that he has been somewhat more fatigued over the last few months.  No recent history of syncope.  He denies peripheral edema.  No tongue biting.   Past Medical History:  Diagnosis Date  . Arthritis   . BPH (benign prostatic hyperplasia)   . Colon polyp   . HLD (hyperlipidemia)   . Obstructive sleep apnea 06/06/2020  . Rectal cancer ([place])   . Vasovagal syncope    dx years ago , reprots " now i sometimes pass out when i get my blood drawn, but now i just get sweaty"     Past Surgical History:  Procedure Laterality Date  . COLONOSCOPY  2013  . INCISION AND DRAINAGE OF WOUND N/A 01/05/2019   Procedure: Excision of Peristomal skin mass;  Surgeon:  Ileana Roup, MD;  Location: [facility];  Service: General;  Laterality: N/A;  . POLYPECTOMY  08-28-11   3 polyps  . RECTAL SURGERY     with colostomy     Home Medications:  Prior to Admission medications   Medication Sig Start Date End Date Taking? Authorizing Provider  atorvastatin (LIPITOR) 20 MG tablet Take 20 mg by mouth at bedtime.   Yes [provider]  Multiple Vitamin (MULTIVITAMIN) tablet Take 1 tablet by mouth daily.   Yes [provider]  pantoprazole (PROTONIX) 40 MG tablet Take 40 mg by mouth at bedtime as needed (for heartburn or reflux). 01/19/18  Yes [provider]  Probiotic Product (PROBIOTIC DAILY PO) Take 1 capsule by mouth daily.   Yes [provider]  SUPREP BOWEL PREP KIT 17.5-3.13-1.6 GM/177ML SOLN Take 1 kit by mouth as directed. For colonoscopy prep Patient not taking: No sig reported 06/07/20   Noralyn Pick, NP    Inpatient Medications: Scheduled Meds: . atorvastatin  20 mg Oral QHS  . enoxaparin (LOVENOX) injection  40 mg Subcutaneous Q24H  . sodium chloride flush  3 mL Intravenous Q12H   Continuous Infusions: . sodium chloride     PRN Meds: sodium chloride, acetaminophen **OR** acetaminophen, dextromethorphan-guaiFENesin, hydrALAZINE, pantoprazole, polyethylene  glycol, senna-docusate, sodium chloride flush  Allergies:   No Known Allergies  Social History:   Social History   Socioeconomic History  . Marital status: Married    Spouse name: Not on file  . Number of children: 1  . Years of education: `  . Highest education level: Not on file  Occupational History  . Occupation: Chief Financial Officer  Tobacco Use  . Smoking status: Former Research scientist (life sciences)  . Smokeless tobacco: Never Used  Vaping Use  . Vaping Use: Never used  Substance and Sexual Activity  . Alcohol use: Yes    Alcohol/week: 1.0 standard drink    Types: 1 Standard drinks or equivalent per week    Comment: socially  . Drug use: No  . Sexual  activity: Not on file  Other Topics Concern  . Not on file  Social History Narrative   2 CUPS OF COFFEE A DAY   Social Determinants of Health   Financial Resource Strain: Not on file  Food Insecurity: Not on file  Transportation Needs: Not on file  Physical Activity: Not on file  Stress: Not on file  Social Connections: Not on file  Intimate Partner Violence: Not on file    Family History:    Family History  Problem Relation Age of Onset  . Ovarian cancer Mother   . Breast cancer Mother   . Hyperlipidemia Father        mother  . Stomach cancer Neg Hx   . Colon cancer Neg Hx   . Esophageal cancer Neg Hx   . Prostate cancer Neg Hx   . Rectal cancer Neg Hx      ROS:  Please see the history of present illness.   All other ROS reviewed and negative.     Physical Exam/Data:   Vitals:   07/06/20 2150 07/06/20 2300 07/07/20 0347 07/07/20 0745  BP: (!) 159/85 127/67 112/65 130/79  Pulse: (!) 55 (!) 51 (!) 56 (!) 58  Resp:  _0 Temp:  98.6 F (37 C) 98.2 F (36.8 C) 98.7 F (37.1 C)  TempSrc:  Oral Oral Oral  SpO2: 100% 97% 93% 96%  Weight:      Height:        Intake/Output Summary (Last 24 hours) at 07/07/2020 1127 Last data filed at 07/07/2020 0415 Gross per 24 hour  Intake 500.94 ml  Output 550 ml  Net -49.06 ml   Last 3 Weights 07/06/2020 07/06/2020 06/07/2020  Weight (lbs) 242 lb 8.1 oz 245 lb 248 lb  Weight (kg) 110 kg 111.131 kg 112.492 kg     Body mass index is 34.8 kg/m.  General:  Well nourished, well developed, in no acute distress HEENT: normal Lymph: no adenopathy Neck: no JVD Endocrine:  No thryomegaly Vascular: No carotid bruits; FA pulses 2+ bilaterally without bruits  Cardiac:  normal S1, S2; RRR; no murmur  Lungs:  clear to auscultation bilaterally, no wheezing, rhonchi or rales  Abd: soft, nontender, no hepatomegaly  Ext: no edema Musculoskeletal:  No deformities, BUE and BLE strength normal and equal Skin: warm and dry  Neuro:   CNs 2-12 intact, no focal abnormalities noted Psych:  Normal affect   EKG:  The EKG was personally reviewed and demonstrates: Normal sinus rhythm with PACs Telemetry:  Telemetry was personally reviewed and demonstrates: Normal sinus rhythm with PACs  Relevant CV Studies: 2D echo is pending  Laboratory Data:  High Sensitivity Troponin:   Recent Labs  Lab 07/06/20 1028 07/06/20 1307  TROPONINIHS 4 5     Chemistry Recent Labs  Lab 07/06/20 1028 07/07/20 0115  NA 139 137  K 4.0 3.9  CL 106 107  CO2 25 23  GLUCOSE 166* 80  BUN 21 18  CREATININE 1.20 1.13  CALCIUM 9.1 8.9  GFRNONAA >60 >60  ANIONGAP 8 7    Recent Labs  Lab 07/06/20 1028  PROT 7.1  ALBUMIN 4.3  AST 26  ALT 36  ALKPHOS 78  BILITOT 0.9   Hematology Recent Labs  Lab 07/06/20 1028  WBC 8.1  RBC 4.91  HGB 15.2  HCT 44.2  MCV 90.0  MCH 31.0  MCHC 34.4  RDW 13.1  PLT 207   BNP Recent Labs  Lab 07/06/20 1028  BNP 14.6    DDimer No results for input(s): DDIMER in the last 168 hours.   Radiology/Studies:  DG Chest 2 View  Result Date: 07/06/2020 CLINICAL DATA:  Syncope. EXAM: CHEST - 2 VIEW COMPARISON:  Chest CT December 06, 2019 FINDINGS: The heart size and mediastinal contours are within normal limits. Both lungs are clear. The visualized skeletal structures are unremarkable. IMPRESSION: No active cardiopulmonary disease. Electronically Signed   By: Abelardo Diesel M.D.   On: 07/06/2020 11:57     Assessment and Plan:   1. Unexplained syncope -most likely etiology of the patient's symptoms is autonomic dysfunction.  Based on the history, he has some evidence of cardiac inhibition.  It is unclear how much of this is contributing.  I have recommended insertion of an implantable loop recorder unless he has an abnormal echo.  If his echo is abnormal, we would consider heart catheterization. 2. Dyslipidemia -he will continue his statin therapy.     For questions or updates, please contact [place] Please consult [email or web address] for contact info under   Signed, Cristopher Peru, MD  07/07/2020 11:27 AM

## 2020-07-07 NOTE — Progress Notes (Signed)
*  PRELIMINARY RESULTS* Echocardiogram 2D Echocardiogram has been performed.  [name] 07/07/2020, 2:58 PM

## 2020-07-07 NOTE — Progress Notes (Signed)
Patient refuse CPAP for the night 

## 2020-07-08 ENCOUNTER — Encounter (HOSPITAL_COMMUNITY): Payer: Self-pay | Admitting: Internal Medicine

## 2020-07-08 ENCOUNTER — Encounter (HOSPITAL_COMMUNITY)
Admission: EM | Disposition: A | Discharge status: Home / Self Care | Payer: Self-pay | Source: Home / Self Care | Attending: Internal Medicine

## 2020-07-08 DIAGNOSIS — N400 Enlarged prostate without lower urinary tract symptoms: Secondary | ICD-10-CM | POA: Diagnosis not present

## 2020-07-08 DIAGNOSIS — R001 Bradycardia, unspecified: Secondary | ICD-10-CM | POA: Diagnosis not present

## 2020-07-08 DIAGNOSIS — R55 Syncope and collapse: Secondary | ICD-10-CM

## 2020-07-08 DIAGNOSIS — Z933 Colostomy status: Secondary | ICD-10-CM | POA: Diagnosis not present

## 2020-07-08 HISTORY — PX: LOOP RECORDER INSERTION: EP1214

## 2020-07-08 LAB — MAGNESIUM: Magnesium: 2.1 mg/dL (ref 1.7–2.4)

## 2020-07-08 LAB — BASIC METABOLIC PANEL
Anion gap: 6 (ref 5–15)
BUN: 19 mg/dL (ref 8–23)
CO2: 24 mmol/L (ref 22–32)
Calcium: 8.8 mg/dL — ABNORMAL LOW (ref 8.9–10.3)
Chloride: 108 mmol/L (ref 98–111)
Creatinine, Ser: 1.38 mg/dL — ABNORMAL HIGH (ref 0.61–1.24)
GFR, Estimated: 55 mL/min — ABNORMAL LOW (ref >60)
Glucose, Bld: 94 mg/dL (ref 70–99)
Potassium: 4.1 mmol/L (ref 3.5–5.1)
Sodium: 138 mmol/L (ref 135–145)

## 2020-07-08 LAB — CBC
HCT: 43.1 % (ref 39.0–52.0)
Hemoglobin: 14.9 g/dL (ref 13.0–17.0)
MCH: 31.3 pg (ref 26.0–34.0)
MCHC: 34.6 g/dL (ref 30.0–36.0)
MCV: 90.5 fL (ref 80.0–100.0)
Platelets: 214 10*3/uL (ref 150–400)
RBC: 4.76 MIL/uL (ref 4.22–5.81)
RDW: 13.2 % (ref 11.5–15.5)
WBC: 8.8 10*3/uL (ref 4.0–10.5)
nRBC: 0 % (ref 0.0–0.2)

## 2020-07-08 SURGERY — LOOP RECORDER INSERTION

## 2020-07-08 MED ORDER — LIDOCAINE-EPINEPHRINE 1 %-1:100000 IJ SOLN
INTRAMUSCULAR | Status: AC
  Filled 2020-07-08: qty 1

## 2020-07-08 MED ORDER — ONDANSETRON HCL 4 MG/2ML IJ SOLN: 4.0000 mg | Freq: Four times a day (QID) | INTRAMUSCULAR | Status: DC | PRN

## 2020-07-08 MED ORDER — ACETAMINOPHEN 325 MG PO TABS: 325.0000 mg | ORAL_TABLET | ORAL | Status: DC | PRN

## 2020-07-08 MED ORDER — LIDOCAINE HCL (PF) 1 % IJ SOLN
INTRAMUSCULAR | Status: DC | PRN
  Administered 2020-07-08: 5 mL

## 2020-07-08 SURGICAL SUPPLY — 2 items
MONITOR REVEAL LINQ II (Prosthesis & Implant Heart) ×2 IMPLANT
PACK LOOP INSERTION (CUSTOM PROCEDURE TRAY) ×2 IMPLANT

## 2020-07-08 NOTE — Plan of Care (Signed)

## 2020-07-08 NOTE — Discharge Summary (Signed)
Physician Discharge Summary  [name] NTZ:[id number] DOB: Feb 15, 1951 DOA: 07/06/2020  PCP: Justin Seashore, MD  Admit date: 07/06/2020 Discharge date: 07/08/2020  Admitted From: Home Disposition: Home  Recommendations for Outpatient Follow-up:  1. Follow up with PCP in 1-2 weeks 2. Please obtain BMP/CBC in one week your next doctors visit.  3. Follow-up outpatient EP cardiology, arrangements will be made by their service 4. Advised to remain compliant with his CPAP  Home Health: Home Equipment/Devices: Home Discharge Condition: Stable CODE STATUS: Full Diet recommendation: Heart healthy  Brief/Interim Summary: 70 year old with history of GERD, hyperlipidemia, rectal cancer status post ostomy presented with symptomatic bradycardia and syncope.  Upon admission noted to be heart rate in 30s, troponin and routine lab work are unremarkable.  Chest x-ray negative.  Cardiology team consulted.  It was recommended to have loop recorder placed.  Loop recorder was placed on 07/08/2020.  During the hospitalization he remained stable without any complaints.  I had extensive discussion with the patient and his family regarding his care.  All the questions were answered.  Echocardiogram showed EF of 60 to 65% with grade 2 diastolic dysfunction.   Symptomatic bradycardia with syncope -Appears to have resolved for now. -Concerns for sick sinus syndrome with long pause? Vs vasovagal. Hx of Lyme disease and covid. This could have led to conduction abnormality.  -Cardiology and EP team consulted-loop recorder to be placed today prior to discharge  -Echocardiogram-EF 60 to 65%, grade 2 DD. -TSH-normal  GERD -PPI  Hyperlipidemia -Statin  History of rectal cancer-adenocarcinoma -Status post ostomy 2020  Obstructive sleep apnea -Bedtime CPAP.  Wonder if this can be contributing to his bradycardia episodes Him and his family has been explained that he needs to remain compliant with his  CPAP.  Body mass index is 34.8 kg/m.         Discharge Diagnoses:  Principal Problem:   Syncope Active Problems:   Rectal cancer (HCC)   Laryngopharyngeal reflux (LPR)   Colostomy present (HCC)   Obstructive sleep apnea   Mixed hyperlipidemia   Benign prostatic hyperplasia without lower urinary tract symptoms   Bradycardia with 41-50 beats per minute      Consultations:  Cardiology  Subjective: Feels great and ambulating the hallway without any issues.  Had extensive discussion with the patient and his family regarding his care, all the questions have been answered.  He understands he needs to remain compliant with his CPAP.  Discharge Exam: Vitals:   07/08/20 1006 07/08/20 1041  BP:  128/66  Pulse: 90 67  Resp:  13  Temp:  98.4 F (36.9 C)  SpO2:  93%   Vitals:   07/08/20 0405 07/08/20 0824 07/08/20 1006 07/08/20 1041  BP: 108/79 98/72  128/66  Pulse: (!) 43 67 90 67  Resp: _0 Temp: 97.9 F (36.6 C)   98.4 F (36.9 C)  TempSrc: Oral Oral  Oral  SpO2: 98% 93%  93%  Weight:      Height:        General: Pt is alert, awake, not in acute distress Cardiovascular: RRR, S1/S2 +, no rubs, no gallops Respiratory: CTA bilaterally, no wheezing, no rhonchi Abdominal: Soft, NT, ND, bowel sounds + Extremities: no edema, no cyanosis  Discharge Instructions   Allergies as of 07/08/2020   No Known Allergies     Medication List    TAKE these medications   atorvastatin 20 MG tablet Commonly known as: LIPITOR Take 20 mg by mouth  at bedtime.   multivitamin tablet Take 1 tablet by mouth daily.   pantoprazole 40 MG tablet Commonly known as: PROTONIX Take 40 mg by mouth at bedtime as needed (for heartburn or reflux).   PROBIOTIC DAILY PO Take 1 capsule by mouth daily.   Suprep Bowel Prep Kit 17.5-3.13-1.6 GM/177ML Soln Generic drug: Na Sulfate-K Sulfate-Mg Sulf Take 1 kit by mouth as directed. For colonoscopy prep       Follow-up  Information    Justin Jamaica, PA-C Follow up.   Specialty: Cardiology Why: on 4/4 at 1145 for electrophysiology visit and loop recorder wound check Contact information: [address] [place] [address] [phone]        Justin Seashore, MD. Schedule an appointment as soon as possible for a visit in 1 week(s).   Specialty: Internal Medicine Contact information: [place] [address] [phone]              No Known Allergies  You were cared for by a hospitalist during your hospital stay. If you have any questions about your discharge medications or the care you received while you were in the hospital after you are discharged, you can call the unit and asked to speak with the hospitalist on call if the hospitalist that took care of you is not available. Once you are discharged, your primary care physician will handle any further medical issues. Please note that no refills for any discharge medications will be authorized once you are discharged, as it is imperative that you return to your primary care physician (or establish a relationship with a primary care physician if you do not have one) for your aftercare needs so that they can reassess your need for medications and monitor your lab values.   Procedures/Studies: DG Chest 2 View  Result Date: 07/06/2020 CLINICAL DATA:  Syncope. EXAM: CHEST - 2 VIEW COMPARISON:  Chest CT December 06, 2019 FINDINGS: The heart size and mediastinal contours are within normal limits. Both lungs are clear. The visualized skeletal structures are unremarkable. IMPRESSION: No active cardiopulmonary disease. Electronically Signed   By: Abelardo Diesel M.D.   On: 07/06/2020 11:57   EP PPM/ICD IMPLANT  Result Date: 07/08/2020 Conclusion: Successful insertion of a [employer] implantable loop recorder in a patient with unexplained syncope. Justin Peru, MD  ECHOCARDIOGRAM COMPLETE  Result Date:  07/07/2020    ECHOCARDIOGRAM REPORT   Patient Name:   [name] Date of Exam: 07/07/2020 Medical Rec #:  [id number]    Height:       70.0 in Accession #:    [id number]   Weight:       242.5 lb Date of Birth:  [birth year]    BSA:          2.265 m Patient Age:    16 years     BP:           130/79 mmHg Patient Gender: M            HR:           58 bpm. Exam Location:  Inpatient Procedure: 2D Echo, Cardiac Doppler and Color Doppler Indications:    Other abnormalities of the heart R00.8  History:        Patient has no prior history of Echocardiogram examinations.                 Signs/Symptoms:Syncope; Risk Factors:Dyslipidemia. Obstructive  sleep apnea, Bradycardia with 41-50 beats per minute.  Sonographer:    Alvino Chapel RCS Referring Phys: [id number] [place]  1. Left ventricular ejection fraction, by estimation, is 60 to 65%. The left ventricle has normal function. The left ventricle has no regional wall motion abnormalities. Left ventricular diastolic parameters are consistent with Grade II diastolic dysfunction (pseudonormalization).  2. Right ventricular systolic function is normal. The right ventricular size is normal. There is normal pulmonary artery systolic pressure. The estimated right ventricular systolic pressure is 81.7 mmHg.  3. Left atrial size was mildly dilated.  4. The mitral valve is normal in structure. Trivial mitral valve regurgitation. No evidence of mitral stenosis.  5. The aortic valve is tricuspid. Aortic valve regurgitation is not visualized. Mild aortic valve sclerosis is present, with no evidence of aortic valve stenosis.  6. The inferior vena cava is normal in size with greater than 50% respiratory variability, suggesting right atrial pressure of 3 mmHg. FINDINGS  Left Ventricle: Left ventricular ejection fraction, by estimation, is 60 to 65%. The left ventricle has normal function. The left ventricle has no regional wall motion abnormalities. The  left ventricular internal cavity size was normal in size. There is  no left ventricular hypertrophy. Left ventricular diastolic parameters are consistent with Grade II diastolic dysfunction (pseudonormalization). Normal left ventricular filling pressure. Right Ventricle: The right ventricular size is normal. No increase in right ventricular wall thickness. Right ventricular systolic function is normal. There is normal pulmonary artery systolic pressure. The tricuspid regurgitant velocity is 2.67 m/s, and  with an assumed right atrial pressure of 3 mmHg, the estimated right ventricular systolic pressure is 71.1 mmHg. Left Atrium: Left atrial size was mildly dilated. Right Atrium: Right atrial size was normal in size. Pericardium: There is no evidence of pericardial effusion. Mitral Valve: The mitral valve is normal in structure. Trivial mitral valve regurgitation. No evidence of mitral valve stenosis. Tricuspid Valve: The tricuspid valve is normal in structure. Tricuspid valve regurgitation is trivial. No evidence of tricuspid stenosis. Aortic Valve: The aortic valve is tricuspid. Aortic valve regurgitation is not visualized. Mild aortic valve sclerosis is present, with no evidence of aortic valve stenosis. Pulmonic Valve: The pulmonic valve was normal in structure. Pulmonic valve regurgitation is trivial. No evidence of pulmonic stenosis. Aorta: The aortic root is normal in size and structure. Venous: The inferior vena cava is normal in size with greater than 50% respiratory variability, suggesting right atrial pressure of 3 mmHg. IAS/Shunts: The interatrial septum appears to be lipomatous. No atrial level shunt detected by color flow Doppler.  LEFT VENTRICLE PLAX 2D LVIDd:         4.10 cm  Diastology LVIDs:         2.70 cm  LV e' medial:    7.40 cm/s LV PW:         1.10 cm  LV E/e' medial:  11.6 LV IVS:        1.00 cm  LV e' lateral:   11.20 cm/s LVOT diam:     2.00 cm  LV E/e' lateral: 7.7 LV SV:         74 LV SV  Index:   33 LVOT Area:     3.14 cm  RIGHT VENTRICLE RV S prime:     14.50 cm/s TAPSE (M-mode): 2.3 cm LEFT ATRIUM             Index       RIGHT ATRIUM  Index LA diam:        4.10 cm 1.81 cm/m  RA Area:     18.70 cm LA Vol (A2C):   84.3 ml 37.21 ml/m RA Volume:   50.20 ml  22.16 ml/m LA Vol (A4C):   75.1 ml 33.15 ml/m LA Biplane Vol: 80.1 ml 35.36 ml/m  AORTIC VALVE LVOT Vmax:   116.00 cm/s LVOT Vmean:  75.900 cm/s LVOT VTI:    0.236 m  AORTA Ao Root diam: 3.60 cm MITRAL VALVE               TRICUSPID VALVE MV Area (PHT): 2.48 cm    TR Peak grad:   28.5 mmHg MV Decel Time: 306 msec    TR Vmax:        267.00 cm/s MV E velocity: 85.90 cm/s MV A velocity: 91.30 cm/s  SHUNTS MV E/A ratio:  0.94        Systemic VTI:  0.24 m                            Systemic Diam: 2.00 cm Fransico Him MD Electronically signed by Fransico Him MD Signature Date/Time: 07/07/2020/4:31:49 PM    Final       The results of significant diagnostics from this hospitalization (including imaging, microbiology, ancillary and laboratory) are listed below for reference.     Microbiology: Recent Results (from the past 240 hour(s))  Resp Panel by RT-PCR (Flu A&B, Covid) Nasopharyngeal Swab     Status: None   Collection Time: 07/06/20 11:01 AM   Specimen: Nasopharyngeal Swab; Nasopharyngeal(NP) swabs in vial transport medium  Result Value Ref Range Status   SARS Coronavirus 2 by RT PCR NEGATIVE NEGATIVE Final    Comment: (NOTE) SARS-CoV-2 target nucleic acids are NOT DETECTED.  The SARS-CoV-2 RNA is generally detectable in upper respiratory specimens during the acute phase of infection. The lowest concentration of SARS-CoV-2 viral copies this assay can detect is 138 copies/mL. A negative result does not preclude SARS-Cov-2 infection and should not be used as the sole basis for treatment or other patient management decisions. A negative result may occur with  improper specimen collection/handling, submission of  specimen other than nasopharyngeal swab, presence of viral mutation(s) within the areas targeted by this assay, and inadequate number of viral copies(<138 copies/mL). A negative result must be combined with clinical observations, patient history, and epidemiological information. The expected result is Negative.  Fact Sheet for Patients:  EntrepreneurPulse.com.au  Fact Sheet for Healthcare Providers:  IncredibleEmployment.be  This test is no t yet approved or cleared by the [place] FDA and  has been authorized for detection and/or diagnosis of SARS-CoV-2 by FDA under an Emergency Use Authorization (EUA). This EUA will remain  in effect (meaning this test can be used) for the duration of the COVID-19 declaration under Section 564(b)(1) of the Act, 21 U.S.C.section 360bbb-3(b)(1), unless the authorization is terminated  or revoked sooner.       Influenza A by PCR NEGATIVE NEGATIVE Final   Influenza B by PCR NEGATIVE NEGATIVE Final    Comment: (NOTE) The Xpert Xpress SARS-CoV-2/FLU/RSV plus assay is intended as an aid in the diagnosis of influenza from Nasopharyngeal swab specimens and should not be used as a sole basis for treatment. Nasal washings and aspirates are unacceptable for Xpert Xpress SARS-CoV-2/FLU/RSV testing.  Fact Sheet for Patients: EntrepreneurPulse.com.au  Fact Sheet for Healthcare Providers: IncredibleEmployment.be  This test is not yet approved  or cleared by the [place] and has been authorized for detection and/or diagnosis of SARS-CoV-2 by FDA under an Emergency Use Authorization (EUA). This EUA will remain in effect (meaning this test can be used) for the duration of the COVID-19 declaration under Section 564(b)(1) of the Act, 21 U.S.C. section 360bbb-3(b)(1), unless the authorization is terminated or revoked.  Performed at [facility], [address] [place], [place] [address]   MRSA PCR Screening     Status: None   Collection Time: 07/06/20 10:16 PM   Specimen: Nasal Mucosa; Nasopharyngeal  Result Value Ref Range Status   MRSA by PCR NEGATIVE NEGATIVE Final    Comment:        The GeneXpert MRSA Assay (FDA approved for NASAL specimens only), is one component of a comprehensive MRSA colonization surveillance program. It is not intended to diagnose MRSA infection nor to guide or monitor treatment for MRSA infections. Performed at [facility], [place] [address], [place], [place] [address]      Labs: BNP (last 3 results) Recent Labs    07/06/20 1028  BNP 43.1   Basic Metabolic Panel: Recent Labs  Lab 07/06/20 1028 07/07/20 0115 07/08/20 0055  NA 139 137 138  K 4.0 3.9 4.1  CL 106 107 108  CO2 _0 GLUCOSE 166* 80 94  BUN _1 CREATININE 1.20 1.13 1.38*  CALCIUM 9.1 8.9 8.8*  MG  --   --  2.1   Liver Function Tests: Recent Labs  Lab 07/06/20 1028  AST 26  ALT 36  ALKPHOS 78  BILITOT 0.9  PROT 7.1  ALBUMIN 4.3   No results for input(s): LIPASE, AMYLASE in the last 168 hours. No results for input(s): AMMONIA in the last 168 hours. CBC: Recent Labs  Lab 07/06/20 1028 07/08/20 0055  WBC 8.1 8.8  NEUTROABS 5.5  --   HGB 15.2 14.9  HCT 44.2 43.1  MCV 90.0 90.5  PLT 207 214   Cardiac Enzymes: No results for input(s): CKTOTAL, CKMB, CKMBINDEX, TROPONINI in the last 168 hours. BNP: Invalid input(s): POCBNP CBG: Recent Labs  Lab 07/06/20 1031  GLUCAP 163*   D-Dimer No results for input(s): DDIMER in the last 72 hours. Hgb A1c No results for input(s): HGBA1C in the last 72 hours. Lipid Profile No results for input(s): CHOL, HDL, LDLCALC, TRIG, CHOLHDL, LDLDIRECT in the last 72 hours. Thyroid function studies Recent Labs    07/07/20 0115  TSH 2.148   Anemia work up No results for input(s): VITAMINB12, FOLATE, FERRITIN, TIBC, IRON, RETICCTPCT in the last 72  hours. Urinalysis    Component Value Date/Time   COLORURINE AMBER (A) 07/06/2020 1027   APPEARANCEUR CLEAR 07/06/2020 1027   LABSPEC 1.025 07/06/2020 1027   PHURINE 7.0 07/06/2020 1027   GLUCOSEU NEGATIVE 07/06/2020 1027   HGBUR NEGATIVE 07/06/2020 [place] 07/06/2020 [place] 07/06/2020 1027   PROTEINUR NEGATIVE 07/06/2020 1027   NITRITE NEGATIVE 07/06/2020 1027   LEUKOCYTESUR NEGATIVE 07/06/2020 1027   Sepsis Labs Invalid input(s): PROCALCITONIN,  WBC,  LACTICIDVEN Microbiology Recent Results (from the past 240 hour(s))  Resp Panel by RT-PCR (Flu A&B, Covid) Nasopharyngeal Swab     Status: None   Collection Time: 07/06/20 11:01 AM   Specimen: Nasopharyngeal Swab; Nasopharyngeal(NP) swabs in vial transport medium  Result Value Ref Range Status   SARS Coronavirus 2 by RT PCR NEGATIVE NEGATIVE Final  Comment: (NOTE) SARS-CoV-2 target nucleic acids are NOT DETECTED.  The SARS-CoV-2 RNA is generally detectable in upper respiratory specimens during the acute phase of infection. The lowest concentration of SARS-CoV-2 viral copies this assay can detect is 138 copies/mL. A negative result does not preclude SARS-Cov-2 infection and should not be used as the sole basis for treatment or other patient management decisions. A negative result may occur with  improper specimen collection/handling, submission of specimen other than nasopharyngeal swab, presence of viral mutation(s) within the areas targeted by this assay, and inadequate number of viral copies(<138 copies/mL). A negative result must be combined with clinical observations, patient history, and epidemiological information. The expected result is Negative.  Fact Sheet for Patients:  EntrepreneurPulse.com.au  Fact Sheet for Healthcare Providers:  IncredibleEmployment.be  This test is no t yet approved or cleared by the [place] FDA and  has been  authorized for detection and/or diagnosis of SARS-CoV-2 by FDA under an Emergency Use Authorization (EUA). This EUA will remain  in effect (meaning this test can be used) for the duration of the COVID-19 declaration under Section 564(b)(1) of the Act, 21 U.S.C.section 360bbb-3(b)(1), unless the authorization is terminated  or revoked sooner.       Influenza A by PCR NEGATIVE NEGATIVE Final   Influenza B by PCR NEGATIVE NEGATIVE Final    Comment: (NOTE) The Xpert Xpress SARS-CoV-2/FLU/RSV plus assay is intended as an aid in the diagnosis of influenza from Nasopharyngeal swab specimens and should not be used as a sole basis for treatment. Nasal washings and aspirates are unacceptable for Xpert Xpress SARS-CoV-2/FLU/RSV testing.  Fact Sheet for Patients: EntrepreneurPulse.com.au  Fact Sheet for Healthcare Providers: IncredibleEmployment.be  This test is not yet approved or cleared by the [place] FDA and has been authorized for detection and/or diagnosis of SARS-CoV-2 by FDA under an Emergency Use Authorization (EUA). This EUA will remain in effect (meaning this test can be used) for the duration of the COVID-19 declaration under Section 564(b)(1) of the Act, 21 U.S.C. section 360bbb-3(b)(1), unless the authorization is terminated or revoked.  Performed at [facility], [address] [place], [place] [address]   MRSA PCR Screening     Status: None   Collection Time: 07/06/20 10:16 PM   Specimen: Nasal Mucosa; Nasopharyngeal  Result Value Ref Range Status   MRSA by PCR NEGATIVE NEGATIVE Final    Comment:        The GeneXpert MRSA Assay (FDA approved for NASAL specimens only), is one component of a comprehensive MRSA colonization surveillance program. It is not intended to diagnose MRSA infection nor to guide or monitor treatment for MRSA infections. Performed at [facility], [place] [address] [place], [place]  [address]      Time coordinating discharge:  I have spent 35 minutes face to face with the patient and on the ward discussing the patients care, assessment, plan and disposition with other care givers. >50% of the time was devoted counseling the patient about the risks and benefits of treatment/Discharge disposition and coordinating care.   SIGNED:   Damita Lack, MD  [facility] 07/08/2020, 10:56 AM   If 7PM-7AM, please contact night-coverage

## 2020-07-08 NOTE — Discharge Instructions (Signed)

## 2020-07-08 NOTE — Progress Notes (Addendum)
Electrophysiology Rounding Note  Patient Name: [name] Date of Encounter: 07/08/2020  Primary Cardiologist: No primary care provider on file. Electrophysiologist: Dr. Lovena Le.    Subjective   The patient is doing well today.  At this time, the patient denies chest pain, shortness of breath, or any new concerns.  Anxious to go home. No questions about loop recorder  Inpatient Medications    Scheduled Meds:  atorvastatin  20 mg Oral QHS   enoxaparin (LOVENOX) injection  40 mg Subcutaneous Q24H   sodium chloride flush  3 mL Intravenous Q12H   Continuous Infusions:  sodium chloride     PRN Meds: sodium chloride, acetaminophen **OR** acetaminophen, dextromethorphan-guaiFENesin, hydrALAZINE, pantoprazole, polyethylene glycol, senna-docusate, sodium chloride flush   Vital Signs    Vitals:   07/07/20 1939 07/07/20 2320 07/08/20 0405 07/08/20 0824  BP: 122/71 114/69 108/79 98/72  Pulse: (!) 53 63 (!) 43 67  Resp: 16 12 14 16   Temp: 98.4 F (36.9 C) 98.5 F (36.9 C) 97.9 F (36.6 C)   TempSrc: Oral Oral Oral Oral  SpO2: 95% 97% 98% 93%  Weight:      Height:        Intake/Output Summary (Last 24 hours) at 07/08/2020 0837 Last data filed at 07/08/2020 0750 Gross per 24 hour  Intake 694 ml  Output --  Net 694 ml   Filed Weights   07/06/20 1011 07/06/20 2149  Weight: 111.1 kg 110 kg    Physical Exam    GEN- The patient is well appearing, alert and oriented x 3 today.   Head- normocephalic, atraumatic Eyes-  Sclera clear, conjunctiva pink Ears- hearing intact Oropharynx- clear Neck- supple Lungs- Clear to ausculation bilaterally, normal work of breathing Heart- Regular rate and rhythm, no murmurs, rubs or gallops GI- soft, NT, ND, + BS Extremities- no clubbing or cyanosis. No edema Skin- no rash or lesion Psych- euthymic mood, full affect Neuro- strength and sensation are intact  Labs    CBC Recent Labs    07/06/20 1028 07/08/20 0055  WBC 8.1 8.8   NEUTROABS 5.5  --   HGB 15.2 14.9  HCT 44.2 43.1  MCV 90.0 90.5  PLT 207 226   Basic Metabolic Panel Recent Labs    07/07/20 0115 07/08/20 0055  NA 137 138  K 3.9 4.1  CL 107 108  CO2 23 24  GLUCOSE 80 94  BUN 18 19  CREATININE 1.13 1.38*  CALCIUM 8.9 8.8*  MG  --  2.1   Liver Function Tests Recent Labs    07/06/20 1028  AST 26  ALT 36  ALKPHOS 78  BILITOT 0.9  PROT 7.1  ALBUMIN 4.3   No results for input(s): LIPASE, AMYLASE in the last 72 hours. Cardiac Enzymes No results for input(s): CKTOTAL, CKMB, CKMBINDEX, TROPONINI in the last 72 hours.   Telemetry    Sinus brady 50-60s (personally reviewed)  Radiology    DG Chest 2 View  Result Date: 07/06/2020 CLINICAL DATA:  Syncope. EXAM: CHEST - 2 VIEW COMPARISON:  Chest CT December 06, 2019 FINDINGS: The heart size and mediastinal contours are within normal limits. Both lungs are clear. The visualized skeletal structures are unremarkable. IMPRESSION: No active cardiopulmonary disease. Electronically Signed   By: Abelardo Diesel M.D.   On: 07/06/2020 11:57   ECHOCARDIOGRAM COMPLETE  Result Date: 07/07/2020    ECHOCARDIOGRAM REPORT   Patient Name:   [name] Date of Exam: 07/07/2020 Medical Rec #:  [id number]  Height:       70.0 in Accession #:    [id number]   Weight:       242.5 lb Date of Birth:  29-Oct-1950    BSA:          2.265 m Patient Age:    70 years     BP:           130/79 mmHg Patient Gender: M            HR:           58 bpm. Exam Location:  Inpatient Procedure: 2D Echo, Cardiac Doppler and Color Doppler Indications:    Other abnormalities of the heart R00.8  History:        Patient has no prior history of Echocardiogram examinations.                 Signs/Symptoms:Syncope; Risk Factors:Dyslipidemia. Obstructive                 sleep apnea, Bradycardia with 41-50 beats per minute.  Sonographer:    Alvino Chapel RCS Referring Phys: [id number] [place]  1. Left ventricular ejection  fraction, by estimation, is 60 to 65%. The left ventricle has normal function. The left ventricle has no regional wall motion abnormalities. Left ventricular diastolic parameters are consistent with Grade II diastolic dysfunction (pseudonormalization).  2. Right ventricular systolic function is normal. The right ventricular size is normal. There is normal pulmonary artery systolic pressure. The estimated right ventricular systolic pressure is 47.8 mmHg.  3. Left atrial size was mildly dilated.  4. The mitral valve is normal in structure. Trivial mitral valve regurgitation. No evidence of mitral stenosis.  5. The aortic valve is tricuspid. Aortic valve regurgitation is not visualized. Mild aortic valve sclerosis is present, with no evidence of aortic valve stenosis.  6. The inferior vena cava is normal in size with greater than 50% respiratory variability, suggesting right atrial pressure of 3 mmHg. FINDINGS  Left Ventricle: Left ventricular ejection fraction, by estimation, is 60 to 65%. The left ventricle has normal function. The left ventricle has no regional wall motion abnormalities. The left ventricular internal cavity size was normal in size. There is  no left ventricular hypertrophy. Left ventricular diastolic parameters are consistent with Grade II diastolic dysfunction (pseudonormalization). Normal left ventricular filling pressure. Right Ventricle: The right ventricular size is normal. No increase in right ventricular wall thickness. Right ventricular systolic function is normal. There is normal pulmonary artery systolic pressure. The tricuspid regurgitant velocity is 2.67 m/s, and  with an assumed right atrial pressure of 3 mmHg, the estimated right ventricular systolic pressure is 29.5 mmHg. Left Atrium: Left atrial size was mildly dilated. Right Atrium: Right atrial size was normal in size. Pericardium: There is no evidence of pericardial effusion. Mitral Valve: The mitral valve is normal in structure.  Trivial mitral valve regurgitation. No evidence of mitral valve stenosis. Tricuspid Valve: The tricuspid valve is normal in structure. Tricuspid valve regurgitation is trivial. No evidence of tricuspid stenosis. Aortic Valve: The aortic valve is tricuspid. Aortic valve regurgitation is not visualized. Mild aortic valve sclerosis is present, with no evidence of aortic valve stenosis. Pulmonic Valve: The pulmonic valve was normal in structure. Pulmonic valve regurgitation is trivial. No evidence of pulmonic stenosis. Aorta: The aortic root is normal in size and structure. Venous: The inferior vena cava is normal in size with greater than 50% respiratory variability, suggesting right atrial pressure of 3 mmHg. IAS/Shunts:  The interatrial septum appears to be lipomatous. No atrial level shunt detected by color flow Doppler.  LEFT VENTRICLE PLAX 2D LVIDd:         4.10 cm  Diastology LVIDs:         2.70 cm  LV e' medial:    7.40 cm/s LV PW:         1.10 cm  LV E/e' medial:  11.6 LV IVS:        1.00 cm  LV e' lateral:   11.20 cm/s LVOT diam:     2.00 cm  LV E/e' lateral: 7.7 LV SV:         74 LV SV Index:   33 LVOT Area:     3.14 cm  RIGHT VENTRICLE RV S prime:     14.50 cm/s TAPSE (M-mode): 2.3 cm LEFT ATRIUM             Index       RIGHT ATRIUM           Index LA diam:        4.10 cm 1.81 cm/m  RA Area:     18.70 cm LA Vol (A2C):   84.3 ml 37.21 ml/m RA Volume:   50.20 ml  22.16 ml/m LA Vol (A4C):   75.1 ml 33.15 ml/m LA Biplane Vol: 80.1 ml 35.36 ml/m  AORTIC VALVE LVOT Vmax:   116.00 cm/s LVOT Vmean:  75.900 cm/s LVOT VTI:    0.236 m  AORTA Ao Root diam: 3.60 cm MITRAL VALVE               TRICUSPID VALVE MV Area (PHT): 2.48 cm    TR Peak grad:   28.5 mmHg MV Decel Time: 306 msec    TR Vmax:        267.00 cm/s MV E velocity: 85.90 cm/s MV A velocity: 91.30 cm/s  SHUNTS MV E/A ratio:  0.94        Systemic VTI:  0.24 m                            Systemic Diam: 2.00 cm Fransico Him MD Electronically signed by Fransico Him MD Signature Date/Time: 07/07/2020/4:31:49 PM    Final     Patient Profile     [name] is a 70 y.o. male with a hx of syncope who is being seen today for the evaluation of syncope and sinus bradycardia at the request of Dr Reesa Chew.  Assessment & Plan    1. Unexplained syncope No further Most likely explained by autonomic dysfunction with cardiac inhibition by history.  Echo 07/07/20 with LVEF 60-65%, RV normal, Grade 2 DD, mild LAE, Trivial MR, No MS or AS. No AR Explained risks, benefits, and alternatives to loop recorder implantation, including but not limited to bleeding and infection. Pt verbalized understanding and agrees to proceed.   Wound care and usual follow to be reviewed with patient and will also be placed in AVS.  For questions or updates, please contact [facility] Please consult [email or web address] for contact info under Cardiology/STEMI.  Signed, Shirley Friar, PA-C  07/08/2020, 8:37 AM   EP Attending  Patient seen and examined. Agree with the findings as noted above. The patient has a h/o unexplained syncope. I suspect autonomic dysfunction. He will undergo insertion of an ILR.  Carleene Overlie Jakylah Bassinger,MD

## 2020-07-08 NOTE — Evaluation (Signed)
Physical Therapy Evaluation/ Discharge Patient Details Name: [name] MRN: [id number] DOB: Jun 11, 1950 Today's Date: 07/08/2020   History of Present Illness  70 yo admitted 3/19 with 3 syncopal episodes and bradycardia. PMHx: arthritis, BPH, HLD, rectal CA, ostomy  Clinical Impression  Pt standing on arrival with wife present. Pt reports sitting when all 3 syncopal events occurred with slight "odd" feeling right before. Pt able to mobilize without assist with HR 90-100 during activity today including walking in hall and stairs. Pt without repeat of syncope or presyncope and moving as he does at baseline currently. No further acute needs as pt independent with mobility. Pt aware and agreeable, will sign off.     Follow Up Recommendations No PT follow up    Equipment Recommendations  None recommended by PT    Recommendations for Other Services       Precautions / Restrictions Precautions Precautions: None      Mobility  Bed Mobility               General bed mobility comments: standing on arrival and to chair end of session    Transfers                    Ambulation/Gait Ambulation/Gait assistance: Independent Gait Distance (Feet): 800 Feet Assistive device: None Gait Pattern/deviations: WFL(Within Functional Limits)   Gait velocity interpretation: >4.37 ft/sec, indicative of normal walking speed General Gait Details: pt with good gait speed and stability with HR 90-100 throughout  Stairs Stairs: Yes Stairs assistance: Modified independent (Device/Increase time) Stair Management: One rail Left;Alternating pattern;Forwards Number of Stairs: 11    Wheelchair Mobility    Modified Rankin (Stroke Patients Only)       Balance Overall balance assessment: No apparent balance deficits (not formally assessed)                                           Pertinent Vitals/Pain Pain Assessment: No/denies pain    Home Living  Family/patient expects to be discharged to:: Private residence Living Arrangements: Spouse/significant other Available Help at Discharge: Family;Available 24 hours/day Type of Home: House Home Access: Stairs to enter   [employer] of Steps: 4 Home Layout: Two level;Able to live on main level with bedroom/bathroom Home Equipment: None      Prior Function Level of Independence: Independent         Comments: 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 but walks a mile a day with dog     Hand Dominance        Extremity/Trunk Assessment   Upper Extremity Assessment Upper Extremity Assessment: Overall WFL for tasks assessed    Lower Extremity Assessment Lower Extremity Assessment: Overall WFL for tasks assessed    Cervical / Trunk Assessment Cervical / Trunk Assessment: Normal  Communication   Communication: No difficulties  Cognition Arousal/Alertness: Awake/alert Behavior During Therapy: WFL for tasks assessed/performed Overall Cognitive Status: Within Functional Limits for tasks assessed                                        General Comments      Exercises     Assessment/Plan    PT Assessment Patent does not need any further PT services  PT Problem  List         PT Treatment Interventions      PT Goals (Current goals can be found in the Care Plan section)  Acute Rehab PT Goals PT Goal Formulation: All assessment and education complete, DC therapy    Frequency     Barriers to discharge        Co-evaluation               AM-PAC PT "6 Clicks" Mobility  Outcome Measure Help needed turning from your back to your side while in a flat bed without using bedrails?: None Help needed moving from lying on your back to sitting on the side of a flat bed without using bedrails?: None Help needed moving to and from a bed to a chair (including a wheelchair)?: None Help needed standing up from a chair using your arms  (e.g., wheelchair or bedside chair)?: None Help needed to walk in hospital room?: None Help needed climbing 3-5 steps with a railing? : None 6 Click Score: 24    End of Session   Activity Tolerance: Patient tolerated treatment well Patient left: in chair;with call bell/phone within reach;with family/visitor present;with nursing/sitter in room Nurse Communication: Mobility status PT Visit Diagnosis: Other abnormalities of gait and mobility (R26.89)    Time: 2248-2500 PT Time Calculation (min) (ACUTE ONLY): 14 min   Charges:   PT Evaluation $PT Eval Low Complexity: [place], PT Acute Rehabilitation Services Pager: [phone] Office: [place] 07/08/2020, 10:09 AM

## 2020-07-08 NOTE — Progress Notes (Addendum)
   07/08/20 1008  OT Visit Information  Last OT Received On 07/08/20  Assistance Needed +1  Reason Eval/Treat Not Completed OT screened, no needs identified, will sign off  History of Present Illness 70 yo admitted 3/19 with 3 syncopal episodes and bradycardia. PMHx: arthritis, BPH, HLD, rectal CA, ostomy  Bed Mobility  General bed mobility comments standing on arrival and to chair end of session  Balance  Overall balance assessment No apparent balance deficits (not formally assessed)   Per PT, pt ambulated halls and completed stairs at mod I to independent level. Pt appears to be at/close to baseline with ADLs.   Tyrone Schimke, OT Acute Rehabilitation Services Pager: [phone] Office: [phone]

## 2020-07-08 NOTE — Progress Notes (Signed)
RN went over discharge summary with pt and pt's wife. RN and Nursing student removed PIVs. Pt belongings with pt and wife. Wife to transport pt home.

## 2020-07-09 ENCOUNTER — Telehealth: Payer: Self-pay

## 2020-07-09 NOTE — Telephone Encounter (Signed)
Spoke to wife and states the ILR site is oozing blood still. Patient is not on [place]. Advised to hold pressure to site for atleast 30 minutes to attempt to stop bleeding. Advised if bleeding stops, this afternoon she can remote the outside dressing leaving the under dressing intact. Advised if bleeding does not stop to please call the device clinic. Follow up apt. discussed with verbal understanding.

## 2020-07-11 DIAGNOSIS — R55 Syncope and collapse: Secondary | ICD-10-CM | POA: Diagnosis not present

## 2020-07-16 DIAGNOSIS — R7301 Impaired fasting glucose: Secondary | ICD-10-CM | POA: Diagnosis not present

## 2020-07-16 DIAGNOSIS — E782 Mixed hyperlipidemia: Secondary | ICD-10-CM | POA: Diagnosis not present

## 2020-07-16 DIAGNOSIS — R55 Syncope and collapse: Secondary | ICD-10-CM | POA: Diagnosis not present

## 2020-07-21 NOTE — Progress Notes (Signed)
Cardiology Office Note Date:  07/21/2020  Patient ID:  Justin, Holden 1950/09/26, MRN [id number] PCP:  Merrilee Seashore, MD  Electrophysiologist: Dr. Lovena Le    Chief Complaint:  loop/wound check  History of Present Illness: [name] is a 70 y.o. male with history of OSA, vagal syncope >> admitted to [facility] with unexplained syncope. He was seen by Dr. Lovena Le, felt most likely etiology of the patient's symptoms is autonomic dysfunction.  Based on the history, he has some evidence of cardiac inhibition with some transient rates 30's in the ER and recommended/underwent loop implant.  TODAY He feels well Has not had any kind of symptoms, particularly no dizzy spells, near syncope or syncope. He denies any CP, palpitations or cardiac awareness.  Reports he healed up well with no wound concerns.    Device information MDT Linq II implanted 07/08/20, syncope   Past Medical History:  Diagnosis Date  . Arthritis   . BPH (benign prostatic hyperplasia)   . Colon polyp   . HLD (hyperlipidemia)   . Obstructive sleep apnea 06/06/2020  . Rectal cancer ([place])   . Vasovagal syncope    dx years ago , reprots " now i sometimes pass out when i get my blood drawn, but now i just get sweaty"     Past Surgical History:  Procedure Laterality Date  . COLONOSCOPY  2013  . INCISION AND DRAINAGE OF WOUND N/A 01/05/2019   Procedure: Excision of Peristomal skin mass;  Surgeon: Ileana Roup, MD;  Location: [facility];  Service: General;  Laterality: N/A;  . LOOP RECORDER INSERTION N/A 07/08/2020   Procedure: LOOP RECORDER INSERTION;  Surgeon: Evans Lance, MD;  Location: [facility];  Service: Cardiovascular;  Laterality: N/A;  . POLYPECTOMY  08-28-11   3 polyps  . RECTAL SURGERY     with colostomy    Current Outpatient Medications  Medication Sig Dispense Refill  . atorvastatin (LIPITOR) 20 MG tablet Take 20 mg by mouth at bedtime.    . Multiple Vitamin (MULTIVITAMIN) tablet  Take 1 tablet by mouth daily.    . pantoprazole (PROTONIX) 40 MG tablet Take 40 mg by mouth at bedtime as needed (for heartburn or reflux).    . Probiotic Product (PROBIOTIC DAILY PO) Take 1 capsule by mouth daily.    Manus Gunning BOWEL PREP KIT 17.5-3.13-1.6 GM/177ML SOLN Take 1 kit by mouth as directed. For colonoscopy prep (Patient not taking: No sig reported) 354 mL 0   No current facility-administered medications for this visit.    Allergies:   Patient has no known allergies.   Social History:  The patient  reports that he has quit smoking. He has never used smokeless tobacco. He reports current alcohol use of about 1.0 standard drink of alcohol per week. He reports that he does not use drugs.   Family History:  The patient's family history includes Breast cancer in his mother; Hyperlipidemia in his father; Ovarian cancer in his mother.  ROS:  Please see the history of present illness.    All other systems are reviewed and otherwise negative.   PHYSICAL EXAM:  VS:  There were no vitals taken for this visit. BMI: There is no height or weight on file to calculate BMI. Well nourished, well developed, in no acute distress HEENT: normocephalic, atraumatic Neck: no JVD, carotid bruits or masses Cardiac:  RRR; no significant murmurs, no rubs, or gallops Lungs:  CTA b/l, no wheezing, rhonchi or rales Abd:  soft, nontender, obese MS: no deformity or atrophy Ext: no edema Skin: warm and dry, no rash Neuro:  No gross deficits appreciated Psych: euthymic mood, full affect  ILR site is stable, is healing well, has a very small scab in place, no erythema, edema or increased heat to the surrounding tissues   EKG:  Done today and reviewed by myself shows  SR, sinus arrhythmia, 59bpm, PACs  Device interrogation done today and reviewed by myself:  Battery is good  No EGM noted No device observations   07/07/2020: TTE IMPRESSIONS  1. Left ventricular ejection fraction, by estimation, is 60  to 65%. The  left ventricle has normal function. The left ventricle has no regional  wall motion abnormalities. Left ventricular diastolic parameters are  consistent with Grade II diastolic  dysfunction (pseudonormalization).  2. Right ventricular systolic function is normal. The right ventricular  size is normal. There is normal pulmonary artery systolic pressure. The  estimated right ventricular systolic pressure is 04.1 mmHg.  3. Left atrial size was mildly dilated.  4. The mitral valve is normal in structure. Trivial mitral valve  regurgitation. No evidence of mitral stenosis.  5. The aortic valve is tricuspid. Aortic valve regurgitation is not  visualized. Mild aortic valve sclerosis is present, with no evidence of  aortic valve stenosis.  6. The inferior vena cava is normal in size with greater than 50%  respiratory variability, suggesting right atrial pressure of 3 mmHg.     Recent Labs: 07/06/2020: ALT 36; B Natriuretic Peptide 14.6 07/07/2020: TSH 2.148 07/08/2020: BUN 19; Creatinine, Ser 1.38; Hemoglobin 14.9; Magnesium 2.1; Platelets 214; Potassium 4.1; Sodium 138  No results found for requested labs within last 8760 hours.   CrCl cannot be calculated (Unknown ideal weight.).   Wt Readings from Last 3 Encounters:  07/06/20 242 lb 8.1 oz (110 kg)  06/07/20 248 lb (112.5 kg)  01/05/19 245 lb 6 oz (111.3 kg)     Other studies reviewed: Additional studies/records reviewed today include: summarized above  ASSESSMENT AND PLAN:  1. Syncope     Suspect atonomic dysfunction, ? Cardiac inhibition     ILR implanted     Site is healing well, very small superficial scab remains, no signs of infection  I interrogated his device a number of times and unable to get a live EGM He had a device transmission 3/30 with EGM Through his APP I sent a symptom transmission and there is an EGM with it. I reached out to [employer] representative and unable to troubleshoot. 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 Lovena Le next month, will keep this.  The patient has not been driving, understands the rational for the recommendation though of course is quite disruptive for him.  Disposition: F/u with remotes as usual and Dr. Lovena Le as scheduled.  Current medicines are reviewed at length with the patient today.  The patient did not have any concerns regarding medicines.  Venetia Night, PA-C 07/21/2020 9:17 AM     [facility] [address] [place] [address] [phone] (office)  [phone] (fax)

## 2020-07-22 ENCOUNTER — Ambulatory Visit: Payer: Medicare Other | Admitting: Physician Assistant

## 2020-07-22 ENCOUNTER — Other Ambulatory Visit: Payer: Self-pay

## 2020-07-22 ENCOUNTER — Encounter: Payer: Self-pay | Admitting: Physician Assistant

## 2020-07-22 VITALS — BP 128/60 | HR 70 | Ht 70.0 in | Wt 244.2 lb

## 2020-07-22 DIAGNOSIS — Z5189 Encounter for other specified aftercare: Secondary | ICD-10-CM

## 2020-07-22 DIAGNOSIS — Z4509 Encounter for adjustment and management of other cardiac device: Secondary | ICD-10-CM

## 2020-07-22 DIAGNOSIS — R001 Bradycardia, unspecified: Secondary | ICD-10-CM

## 2020-07-22 DIAGNOSIS — R55 Syncope and collapse: Secondary | ICD-10-CM | POA: Diagnosis not present

## 2020-07-22 DIAGNOSIS — R1011 Right upper quadrant pain: Secondary | ICD-10-CM | POA: Diagnosis not present

## 2020-07-22 LAB — CUP PACEART INCLINIC DEVICE CHECK
Date Time Interrogation Session: 20220404132428
Implantable Pulse Generator Implant Date: 20220321

## 2020-07-22 NOTE — Patient Instructions (Addendum)
Medication Instructions:   Your physician recommends that you continue on your current medications as directed. Please refer to the Current Medication list given to you today.   *If you need a refill on your cardiac medications before your next appointment, please call your pharmacy*   Lab Work: [place]   If you have labs (blood work) drawn today and your tests are completely normal, you will receive your results only by: Marland Kitchen MyChart Message (if you have MyChart) OR . A paper copy in the mail If you have any lab test that is abnormal or we need to change your treatment, we will call you to review the results.   Testing/Procedures: NONE ORDERED  TODAY   Follow-Up: At [facility], you and your health needs are our priority.  As part of our continuing mission to provide you with exceptional heart care, we have created designated Provider Care Teams.  These Care Teams include your primary Cardiologist (physician) and Advanced Practice Providers (APPs -  Physician Assistants and Nurse Practitioners) who all work together to provide you with the care you need, when you need it.  We recommend signing up for the patient portal called "MyChart".  Sign up information is provided on this After Visit Summary.  MyChart is used to connect with patients for Virtual Visits (Telemedicine).  Patients are able to view lab/test results, encounter notes, upcoming appointments, etc.  Non-urgent messages can be sent to your provider as well.   To learn more about what you can do with MyChart, go to [email or web address].    Your next appointment:    AS SCHEDULED   The format for your next appointment:   In Person  Provider:   You may see Dr. Lovena Le  or one of the following Advanced Practice Providers on your designated Care Team:    Chanetta Marshall, NP  Tommye Standard, PA-C  Legrand Como "Oda Kilts, [place]    Other Instructions

## 2020-07-22 NOTE — Addendum Note (Signed)
Addended by: Claude Manges on: 07/22/2020 04:02 PM   Modules accepted: Orders

## 2020-07-23 ENCOUNTER — Other Ambulatory Visit: Payer: Self-pay | Admitting: Internal Medicine

## 2020-07-23 DIAGNOSIS — R1011 Right upper quadrant pain: Secondary | ICD-10-CM

## 2020-07-29 ENCOUNTER — Telehealth: Payer: Self-pay | Admitting: Gastroenterology

## 2020-07-29 DIAGNOSIS — Z933 Colostomy status: Secondary | ICD-10-CM | POA: Diagnosis not present

## 2020-07-29 NOTE — Telephone Encounter (Signed)
Discussed with wife foods to avoid starting 4-20 and we discussed what he CAN eat starting that Wednesday as well-

## 2020-07-29 NOTE — Telephone Encounter (Signed)
Inbound call from patient wife, Dub Mikes. She wants clarification on foods patient can eat leading up to procedure 08/07/2020. Would like a call back at [phone]

## 2020-08-05 ENCOUNTER — Telehealth: Payer: Self-pay | Admitting: Gastroenterology

## 2020-08-05 ENCOUNTER — Telehealth: Payer: Self-pay | Admitting: Internal Medicine

## 2020-08-05 NOTE — Telephone Encounter (Signed)
Wife was calling about question concern the restriction on the driving ability for the patient. Please advise

## 2020-08-05 NOTE — Telephone Encounter (Signed)
Inbound call from patient. Cancelled EDG/Colon for 4/20. Rescheduled for 7/6.

## 2020-08-07 ENCOUNTER — Encounter: Payer: Medicare Other | Admitting: Gastroenterology

## 2020-08-12 ENCOUNTER — Ambulatory Visit (INDEPENDENT_AMBULATORY_CARE_PROVIDER_SITE_OTHER): Payer: Medicare Other

## 2020-08-12 DIAGNOSIS — R55 Syncope and collapse: Secondary | ICD-10-CM | POA: Diagnosis not present

## 2020-08-13 LAB — CUP PACEART REMOTE DEVICE CHECK
Date Time Interrogation Session: 20220424110939
Implantable Pulse Generator Implant Date: 20220321

## 2020-08-16 ENCOUNTER — Other Ambulatory Visit: Payer: Self-pay

## 2020-08-16 ENCOUNTER — Ambulatory Visit
Admission: RE | Admit: 2020-08-16 | Discharge: 2020-08-16 | Disposition: A | Discharge status: Home / Self Care | Payer: Medicare Other | Source: Ambulatory Visit | Attending: Internal Medicine | Admitting: Internal Medicine

## 2020-08-16 DIAGNOSIS — K7689 Other specified diseases of liver: Secondary | ICD-10-CM | POA: Diagnosis not present

## 2020-08-16 DIAGNOSIS — R1011 Right upper quadrant pain: Secondary | ICD-10-CM

## 2020-08-30 NOTE — Progress Notes (Signed)
Carelink Summary Report / Loop Recorder 

## 2020-09-10 ENCOUNTER — Encounter: Payer: Self-pay | Admitting: Internal Medicine

## 2020-09-10 ENCOUNTER — Ambulatory Visit: Payer: Medicare Other | Admitting: Internal Medicine

## 2020-09-10 ENCOUNTER — Other Ambulatory Visit: Payer: Self-pay

## 2020-09-10 VITALS — BP 130/68 | HR 57 | Ht 70.0 in | Wt 242.8 lb

## 2020-09-10 DIAGNOSIS — R55 Syncope and collapse: Secondary | ICD-10-CM

## 2020-09-10 NOTE — Progress Notes (Signed)
HPI Mr. Mounsey returns today for ongoing evaluation and management of syncope. He is a pleasant 70 yo man with a h/o sinus bradycardia and syncope. He has undergone insertion of an ILR. He has not had recurrent syncope. He has had some sinus brady on his ILR into the 30's but was asymptomatic. He is able to walk without limit and over all feels well.  He is frustrated by his inability to drive.  No Known Allergies   Current Outpatient Medications  Medication Sig Dispense Refill  . atorvastatin (LIPITOR) 20 MG tablet Take 20 mg by mouth at bedtime.    . Multiple Vitamin (MULTIVITAMIN) tablet Take 1 tablet by mouth daily.    . pantoprazole (PROTONIX) 40 MG tablet Take 40 mg by mouth at bedtime as needed (for heartburn or reflux).    Manus Gunning BOWEL PREP KIT 17.5-3.13-1.6 GM/177ML SOLN Take 1 kit by mouth as directed. For colonoscopy prep 354 mL 0   No current facility-administered medications for this visit.     Past Medical History:  Diagnosis Date  . Arthritis   . BPH (benign prostatic hyperplasia)   . Colon polyp   . HLD (hyperlipidemia)   . Obstructive sleep apnea 06/06/2020  . Rectal cancer ([place])   . Vasovagal syncope    dx years ago , reprots " now i sometimes pass out when i get my blood drawn, but now i just get sweaty"     ROS:   All systems reviewed and negative except as noted in the HPI.   Past Surgical History:  Procedure Laterality Date  . COLONOSCOPY  2013  . INCISION AND DRAINAGE OF WOUND N/A 01/05/2019   Procedure: Excision of Peristomal skin mass;  Surgeon: Ileana Roup, MD;  Location: [facility];  Service: General;  Laterality: N/A;  . LOOP RECORDER INSERTION N/A 07/08/2020   Procedure: LOOP RECORDER INSERTION;  Surgeon: Evans Lance, MD;  Location: [facility];  Service: Cardiovascular;  Laterality: N/A;  . POLYPECTOMY  08-28-11   3 polyps  . RECTAL SURGERY     with colostomy     Family History  Problem Relation Age of Onset  .  Ovarian cancer Mother   . Breast cancer Mother   . Hyperlipidemia Father        mother  . Stomach cancer Neg Hx   . Colon cancer Neg Hx   . Esophageal cancer Neg Hx   . Prostate cancer Neg Hx   . Rectal cancer Neg Hx      Social History   Socioeconomic History  . Marital status: Married    Spouse name: Not on file  . Number of children: 1  . Years of education: `  . Highest education level: Not on file  Occupational History  . Occupation: Chief Financial Officer  Tobacco Use  . Smoking status: Former Research scientist (life sciences)  . Smokeless tobacco: Never Used  Vaping Use  . Vaping Use: Never used  Substance and Sexual Activity  . Alcohol use: Yes    Alcohol/week: 1.0 standard drink    Types: 1 Standard drinks or equivalent per week    Comment: socially  . Drug use: No  . Sexual activity: Not on file  Other Topics Concern  . Not on file  Social History Narrative   2 CUPS OF COFFEE A DAY   Social Determinants of Health   Financial Resource Strain: Not on file  Food Insecurity: Not on file  Transportation Needs: Not  on file  Physical Activity: Not on file  Stress: Not on file  Social Connections: Not on file  Intimate Partner Violence: Not on file     BP 130/68   Pulse (!) 57   Ht $R'5\' 10"'Fr$  (1.778 m)   Wt 242 lb 12.8 oz (110.1 kg)   SpO2 96%   BMI 34.84 kg/m   Physical Exam:  Well appearing NAD HEENT: Unremarkable Neck:  No JVD, no thyromegally Lymphatics:  No adenopathy Back:  No CVA tenderness Lungs:  Clear with no wheezes HEART:  Regular rate rhythm, no murmurs, no rubs, no clicks Abd:  soft, positive bowel sounds, no organomegally, no rebound, no guarding Ext:  2 plus pulses, no edema, no cyanosis, no clubbing Skin:  No rashes no nodules Neuro:  CN II through XII intact, motor grossly intact  DEVICE  Normal device function.  See PaceArt for details.   Assess/Plan: 1. Unexplained syncope - he is s/p ILR insertion with no symptomatic brady or tachycardia 2. HTN - his bp is  controlled. No change in meds. 3. Obesity - he is encouraged to lose weight.   Carleene Overlie Jalena Vanderlinden,MD

## 2020-09-10 NOTE — Patient Instructions (Signed)
Medication Instructions:  Your physician recommends that you continue on your current medications as directed. Please refer to the Current Medication list given to you today.  Labwork: None ordered.  Testing/Procedures: None ordered.  Follow-Up: Your physician wants you to follow-up in: one year with Cristopher Peru, MD or one of the following Advanced Practice Providers on your designated Care Team:    Chanetta Marshall, NP  Tommye Standard, PA-C  Legrand Como "Jonni Sanger" [place], [place]  Remote monitoring is used to monitor your loop recorder from home.   Any Other Special Instructions Will Be Listed Below (If Applicable).  If you need a refill on your cardiac medications before your next appointment, please call your pharmacy.

## 2020-09-16 LAB — CUP PACEART REMOTE DEVICE CHECK
Date Time Interrogation Session: 20220527111127
Implantable Pulse Generator Implant Date: 20220321

## 2020-09-17 ENCOUNTER — Ambulatory Visit (INDEPENDENT_AMBULATORY_CARE_PROVIDER_SITE_OTHER): Payer: Medicare Other

## 2020-09-17 DIAGNOSIS — R55 Syncope and collapse: Secondary | ICD-10-CM

## 2020-10-03 ENCOUNTER — Telehealth: Payer: Self-pay | Admitting: Internal Medicine

## 2020-10-03 NOTE — Telephone Encounter (Signed)
Pt complaining of feeling light headed twice this morning at 630am and 1140am.  Pt denies actually passing out, CP or SOB.  Pt did not press symptom activator as he did not have phone with him.  Will forward to device clinic to have ILR reviewed.  Reviewed ED precautions. Pt verbalizes understanding and agrees with current plan.

## 2020-10-03 NOTE — Telephone Encounter (Signed)
pt said hes had some funny feelings today and would like someone to check his device, would like to know if anything is going on... please advise

## 2020-10-03 NOTE — Telephone Encounter (Addendum)
Received transmission. 1 Brady episode noted on 10/03/20. Attempted to contact patient to advise transmission received. Left message on answering machine.

## 2020-10-04 ENCOUNTER — Telehealth: Payer: Self-pay | Admitting: Internal Medicine

## 2020-10-04 NOTE — Telephone Encounter (Signed)
The patient returned the nurse call. He states the nurse can call him back at the [phone].

## 2020-10-04 NOTE — Telephone Encounter (Signed)
Wife of patient following up from call yesterday. The patient's HR dropped 2x yesterday. The readings were caught on the loop recorder yesterday.   Patient's wife wanted to know more about what to do now after getting readings from the loop recorder. Wife has a lot of questions she would like to speak to Dr. Tanna Furry Nurse about.

## 2020-10-04 NOTE — Telephone Encounter (Signed)
Spoke with patient. Patient states no symptoms today. Patient given ED precautions if he becomes symptomatic. Routed previous transmission to Dr. Lovena Le for review. Advised patient that I had sent information to Dr.Taylor for review. Patient also advised that he could contact the Device clinic at [phone] if any other issues arise and wanted the clinic to review a transmission.

## 2020-10-07 NOTE — Telephone Encounter (Signed)
This is Follow-up to 10/03/20 phone call regarding Bradycardic episodes on 6/16.    Advised pt spouse that episodes on 6/16 do not appear to be related to pt HR.  Stressed maintianinng good hydration when working in heat, as well as checking BP if he has another spell.

## 2020-10-10 NOTE — Progress Notes (Signed)
Carelink Summary Report / Loop Recorder 

## 2020-10-16 ENCOUNTER — Ambulatory Visit (INDEPENDENT_AMBULATORY_CARE_PROVIDER_SITE_OTHER): Payer: Medicare Other

## 2020-10-16 DIAGNOSIS — R55 Syncope and collapse: Secondary | ICD-10-CM

## 2020-10-16 LAB — CUP PACEART REMOTE DEVICE CHECK
Date Time Interrogation Session: 20220629111401
Implantable Pulse Generator Implant Date: 20220321

## 2020-10-23 ENCOUNTER — Encounter: Payer: Medicare Other | Admitting: Gastroenterology

## 2020-10-25 ENCOUNTER — Telehealth: Payer: Self-pay | Admitting: Gastroenterology

## 2020-10-25 NOTE — Telephone Encounter (Signed)
Inbound call from patient wife requesting up to date prep instructions. [phone]

## 2020-10-28 DIAGNOSIS — Z933 Colostomy status: Secondary | ICD-10-CM | POA: Diagnosis not present

## 2020-10-28 NOTE — Telephone Encounter (Signed)
Inbound call from pt's wife Joaquim Lai requesting a call back. Please call [phone]. Thanks.

## 2020-10-29 DIAGNOSIS — Z933 Colostomy status: Secondary | ICD-10-CM | POA: Diagnosis not present

## 2020-10-29 NOTE — Telephone Encounter (Signed)
Spoke with the patient, he had his colonoscopy scheduled back in April but had to change it. Patient still has the [place] so I let him know what times to do his prep. Patient expressed understanding and agreement.

## 2020-11-01 ENCOUNTER — Encounter: Payer: Self-pay | Admitting: Gastroenterology

## 2020-11-01 ENCOUNTER — Ambulatory Visit (AMBULATORY_SURGERY_CENTER): Payer: Medicare Other | Admitting: Gastroenterology

## 2020-11-01 ENCOUNTER — Other Ambulatory Visit: Payer: Self-pay

## 2020-11-01 VITALS — BP 105/60 | HR 53 | Temp 98.9°F | Resp 14 | Ht 70.0 in | Wt 248.0 lb

## 2020-11-01 DIAGNOSIS — K222 Esophageal obstruction: Secondary | ICD-10-CM

## 2020-11-01 DIAGNOSIS — D124 Benign neoplasm of descending colon: Secondary | ICD-10-CM | POA: Diagnosis not present

## 2020-11-01 DIAGNOSIS — D120 Benign neoplasm of cecum: Secondary | ICD-10-CM | POA: Diagnosis not present

## 2020-11-01 DIAGNOSIS — Z85048 Personal history of other malignant neoplasm of rectum, rectosigmoid junction, and anus: Secondary | ICD-10-CM | POA: Diagnosis not present

## 2020-11-01 DIAGNOSIS — R1310 Dysphagia, unspecified: Secondary | ICD-10-CM

## 2020-11-01 DIAGNOSIS — Z1211 Encounter for screening for malignant neoplasm of colon: Secondary | ICD-10-CM | POA: Diagnosis not present

## 2020-11-01 DIAGNOSIS — D123 Benign neoplasm of transverse colon: Secondary | ICD-10-CM | POA: Diagnosis not present

## 2020-11-01 DIAGNOSIS — D122 Benign neoplasm of ascending colon: Secondary | ICD-10-CM | POA: Diagnosis not present

## 2020-11-01 DIAGNOSIS — C20 Malignant neoplasm of rectum: Secondary | ICD-10-CM

## 2020-11-01 MED ORDER — SODIUM CHLORIDE 0.9 % IV SOLN: 500.0000 mL | Freq: Once | INTRAVENOUS | Status: DC

## 2020-11-01 NOTE — Progress Notes (Signed)
To PACU, VSS. Report to [email or web address] 

## 2020-11-01 NOTE — Patient Instructions (Signed)
Handout given:  polyps Resume previous diet Continue current medications Await pathology results YOU HAD AN ENDOSCOPIC PROCEDURE TODAY AT [facility]:   Refer to the procedure report that was given to you for any specific questions about what was found during the examination.  If the procedure report does not answer your questions, please call your gastroenterologist to clarify.  If you requested that your care partner not be given the details of your procedure findings, then the procedure report has been included in a sealed envelope for you to review at your convenience later.  YOU SHOULD EXPECT: Some feelings of bloating in the abdomen. Passage of more gas than usual.  Walking can help get rid of the air that was put into your GI tract during the procedure and reduce the bloating. If you had a lower endoscopy (such as a colonoscopy or flexible sigmoidoscopy) you may notice spotting of blood in your stool or on the toilet paper. If you underwent a bowel prep for your procedure, you may not have a normal bowel movement for a few days.  Please Note:  You might notice some irritation and congestion in your nose or some drainage.  This is from the oxygen used during your procedure.  There is no need for concern and it should clear up in a day or so.  SYMPTOMS TO REPORT IMMEDIATELY:  Following lower endoscopy (colonoscopy or flexible sigmoidoscopy):  Excessive amounts of blood in the stool  Significant tenderness or worsening of abdominal pains  Swelling of the abdomen that is new, acute  Fever of 100F or higher  Following upper endoscopy (EGD)  Vomiting of blood or coffee ground material  New chest pain or pain under the shoulder blades  Painful or persistently difficult swallowing  New shortness of breath  Fever of 100F or higher  Black, tarry-looking stools  For urgent or emergent issues, a gastroenterologist can be reached at any hour by calling [phone]. Do not  use MyChart messaging for urgent concerns.   DIET:  We do recommend a small meal at first, but then you may proceed to your regular diet.  Drink plenty of fluids but you should avoid alcoholic beverages for 24 hours.  ACTIVITY:  You should plan to take it easy for the rest of today and you should NOT DRIVE or use heavy machinery until tomorrow (because of the sedation medicines used during the test).    FOLLOW UP: Our staff will call the number listed on your records 48-72 hours following your procedure to check on you and address any questions or concerns that you may have regarding the information given to you following your procedure. If we do not reach you, we will leave a message.  We will attempt to reach you two times.  During this call, we will ask if you have developed any symptoms of COVID 19. If you develop any symptoms (ie: fever, flu-like symptoms, shortness of breath, cough etc.) before then, please call [phone].  If you test positive for Covid 19 in the 2 weeks post procedure, please call and report this information to [place].    If any biopsies were taken you will be contacted by phone or by letter within the next 1-3 weeks.  Please call us at [phone] if you have not heard about the biopsies in 3 weeks.   SIGNATURES/CONFIDENTIALITY: You and/or your care partner have signed paperwork which will be entered into your electronic medical record.  These signatures attest  to the fact that that the information above on your After Visit Summary has been reviewed and is understood.  Full responsibility of the confidentiality of this discharge information lies with you and/or your care-partner.

## 2020-11-01 NOTE — Op Note (Signed)
[place] [facility] Patient Name: [name] Procedure Date: 11/01/2020 2:23 PM MRN: [id number] Endoscopist: Milus Banister , MD Age: 70 Referring MD:  Date of Birth: 21-May-1950 Gender: Male Account #: [id number] Procedure:                Upper GI endoscopy Indications:              Dysphagia, Heartburn Medicines:                Monitored Anesthesia Care Procedure:                Pre-Anesthesia Assessment:                           - Prior to the procedure, a History and Physical                            was performed, and patient medications and                            allergies were reviewed. The patient's tolerance of                            previous anesthesia was also reviewed. The risks                            and benefits of the procedure and the sedation                            options and risks were discussed with the patient.                            All questions were answered, and informed consent                            was obtained. Prior Anticoagulants: The patient has                            taken no previous anticoagulant or antiplatelet                            agents. ASA Grade Assessment: II - A patient with                            mild systemic disease. After reviewing the risks                            and benefits, the patient was deemed in                            satisfactory condition to undergo the procedure.                           After obtaining informed consent, the endoscope was  passed under direct vision. Throughout the                            procedure, the patient's blood pressure, pulse, and                            oxygen saturations were monitored continuously. The                            Endoscope was introduced through the mouth, and                            advanced to the second part of duodenum. The upper                            GI endoscopy was accomplished without  difficulty.                            The patient tolerated the procedure well. Scope In: Scope Out: Findings:                 One benign-appearing, intrinsic mild stenosis                            (edematous GE junction) was found at the                            gastroesophageal junction. A TTS dilator was passed                            through the scope. Dilation with an 18-19-20 mm                            balloon dilator was performed to 20 mm.                           The exam was otherwise without abnormality. Complications:            No immediate complications. Estimated blood loss:                            None. Estimated Blood Loss:     Estimated blood loss: none. Impression:               - Benign-appearing esophageal stenosis, dilated to                            42mm today.                           - The examination was otherwise normal. Recommendation:           - Patient has a contact number available for                            emergencies. The signs and symptoms of potential  delayed complications were discussed with the                            patient. Return to normal activities tomorrow.                            Written discharge instructions were provided to the                            patient.                           - Resume previous diet.                           - Continue present medications. Milus Banister, MD 11/01/2020 3:11:50 PM This report has been signed electronically.

## 2020-11-01 NOTE — Progress Notes (Signed)
Called to room to assist during endoscopic procedure.  Patient ID and intended procedure confirmed with present staff. Received instructions for my participation in the procedure from the performing physician.  

## 2020-11-01 NOTE — Op Note (Signed)
[facility] Patient Name: [name] Procedure Date: 11/01/2020 2:29 PM MRN: [id number] Endoscopist: Milus Banister , MD Age: 70 Referring MD:  Date of Birth: October 15, 1950 Gender: Male Account #: [id number] Procedure:                Colonoscopy Indications:              High risk colon cancer surveillance: Personal                            history of colon cancer; s/p APR for low lying                            rectal adenocarcinoma in 2008, colonoscopy Dr. Lajoyce Corners                            2009 found small polyp, colonoscopy Dr. Ardis Hughs 2013                            found 3 small adenomas. Colonoscopy via ostomy 2016                            two subCM adenomas Medicines:                Monitored Anesthesia Care Procedure:                Pre-Anesthesia Assessment:                           - Prior to the procedure, a History and Physical                            was performed, and patient medications and                            allergies were reviewed. The patient's tolerance of                            previous anesthesia was also reviewed. The risks                            and benefits of the procedure and the sedation                            options and risks were discussed with the patient.                            All questions were answered, and informed consent                            was obtained. Prior Anticoagulants: The patient has                            taken no previous anticoagulant or antiplatelet  agents. ASA Grade Assessment: II - A patient with                            mild systemic disease. After reviewing the risks                            and benefits, the patient was deemed in                            satisfactory condition to undergo the procedure.                           After obtaining informed consent, the colonoscope                            was passed under direct vision. Throughout the                             procedure, the patient's blood pressure, pulse, and                            oxygen saturations were monitored continuously. The                            Olympus CF-HQ190L [id number]) Colonoscope was                            introduced through the left sided colostomy and                            advanced to the the cecum, identified by                            appendiceal orifice and ileocecal valve. The                            colonoscopy was performed without difficulty. The                            patient tolerated the procedure well. The quality                            of the bowel preparation was good. The ileocecal                            valve, appendiceal orifice, and rectum were                            photographed. Scope In: 2:38:49 PM Scope Out: 2:22:97 PM Scope Withdrawal Time: 0 hours 12 minutes 30 seconds  Total Procedure Duration: 0 hours 17 minutes 30 seconds  Findings:                 Colonoscopy via ostomy:  Four sessile polyps were found in the descending                            colon, transverse colon, ascending colon and cecum.                            The polyps were 3 to 7 mm in size. These polyps                            were removed with a cold snare. Resection and                            retrieval were complete.                           The exam was otherwise without abnormality on                            direct views. Complications:            No immediate complications. Estimated blood loss:                            None. Estimated Blood Loss:     Estimated blood loss: none. Impression:               - Four 3 to 7 mm polyps in the descending colon, in                            the transverse colon, in the ascending colon and in                            the cecum, removed with a cold snare. Resected and                            retrieved.                            - The examination was otherwise normal on direct                            and retroflexion views. Recommendation:           - Await pathology results.                           - EGD now. Milus Banister, MD 11/01/2020 3:09:55 PM This report has been signed electronically.

## 2020-11-01 NOTE — Progress Notes (Signed)
Pt's states no medical or surgical changes since previsit or office visit. 

## 2020-11-01 NOTE — Progress Notes (Signed)
C.W. vital signs. 

## 2020-11-05 ENCOUNTER — Telehealth: Payer: Self-pay | Admitting: *Deleted

## 2020-11-05 NOTE — Telephone Encounter (Signed)
Attempted 2nd f/u phone call. No answer. Left message.  °

## 2020-11-05 NOTE — Progress Notes (Signed)
Carelink Summary Report / Loop Recorder 

## 2020-11-05 NOTE — Telephone Encounter (Signed)
Message left

## 2020-11-08 ENCOUNTER — Encounter: Payer: Self-pay | Admitting: Gastroenterology

## 2020-11-18 ENCOUNTER — Ambulatory Visit (INDEPENDENT_AMBULATORY_CARE_PROVIDER_SITE_OTHER): Payer: Medicare Other

## 2020-11-18 DIAGNOSIS — R55 Syncope and collapse: Secondary | ICD-10-CM

## 2020-11-18 LAB — CUP PACEART REMOTE DEVICE CHECK
Date Time Interrogation Session: 20220801034135
Implantable Pulse Generator Implant Date: 20220321

## 2020-11-20 DIAGNOSIS — Z03818 Encounter for observation for suspected exposure to other biological agents ruled out: Secondary | ICD-10-CM | POA: Diagnosis not present

## 2020-11-20 DIAGNOSIS — R350 Frequency of micturition: Secondary | ICD-10-CM | POA: Diagnosis not present

## 2020-11-21 DIAGNOSIS — R350 Frequency of micturition: Secondary | ICD-10-CM | POA: Diagnosis not present

## 2020-11-21 DIAGNOSIS — M545 Low back pain, unspecified: Secondary | ICD-10-CM | POA: Diagnosis not present

## 2020-11-21 DIAGNOSIS — R3911 Hesitancy of micturition: Secondary | ICD-10-CM | POA: Diagnosis not present

## 2020-11-21 DIAGNOSIS — L84 Corns and callosities: Secondary | ICD-10-CM | POA: Diagnosis not present

## 2020-12-11 NOTE — Progress Notes (Signed)
Carelink Summary Report / Loop Recorder 

## 2020-12-24 ENCOUNTER — Ambulatory Visit (INDEPENDENT_AMBULATORY_CARE_PROVIDER_SITE_OTHER): Payer: Medicare Other

## 2020-12-24 DIAGNOSIS — R55 Syncope and collapse: Secondary | ICD-10-CM | POA: Diagnosis not present

## 2020-12-24 LAB — CUP PACEART REMOTE DEVICE CHECK
Date Time Interrogation Session: 20220903111414
Implantable Pulse Generator Implant Date: 20220321

## 2021-01-01 DIAGNOSIS — Z933 Colostomy status: Secondary | ICD-10-CM | POA: Diagnosis not present

## 2021-01-02 NOTE — Progress Notes (Signed)
Carelink Summary Report / Loop Recorder 

## 2021-01-14 DIAGNOSIS — E782 Mixed hyperlipidemia: Secondary | ICD-10-CM | POA: Diagnosis not present

## 2021-01-14 DIAGNOSIS — N182 Chronic kidney disease, stage 2 (mild): Secondary | ICD-10-CM | POA: Diagnosis not present

## 2021-01-14 DIAGNOSIS — Z23 Encounter for immunization: Secondary | ICD-10-CM | POA: Diagnosis not present

## 2021-01-14 DIAGNOSIS — R7301 Impaired fasting glucose: Secondary | ICD-10-CM | POA: Diagnosis not present

## 2021-01-27 ENCOUNTER — Ambulatory Visit (INDEPENDENT_AMBULATORY_CARE_PROVIDER_SITE_OTHER): Payer: Medicare Other

## 2021-01-27 DIAGNOSIS — R55 Syncope and collapse: Secondary | ICD-10-CM

## 2021-01-29 LAB — CUP PACEART REMOTE DEVICE CHECK
Date Time Interrogation Session: 20221006111121
Implantable Pulse Generator Implant Date: 20220321

## 2021-02-05 NOTE — Progress Notes (Signed)
Carelink Summary Report / Loop Recorder 

## 2021-02-11 ENCOUNTER — Telehealth: Payer: Self-pay

## 2021-02-11 ENCOUNTER — Telehealth: Payer: Self-pay | Admitting: Physician Assistant

## 2021-02-11 NOTE — Telephone Encounter (Signed)
Pt wife called stating he has a headache and HR in the 40s. They spoke with our office today who reviewed his ILR. He is not having dizziness, pre-syncope, or syncope. He is on no AV nodal agents. He does have a headache, but no changes in vision, speech, or weakness. He has not treated the headache yet, which may be related to sinus issues. SBP in the 150s. I advised treating the headache and continuing to monitor at home. If he has pre-syncope or syncope, would require an ER visit. He is not having stroke-like symptoms. She is comfortable watching him at home and proceeding to ER should he have a change in symptoms.    Ledora Bottcher, PA-C 02/11/2021, 6:42 PM [facility] [address] [place], [place] [id number]

## 2021-02-11 NOTE — Telephone Encounter (Signed)
Incoming call from patient's wife wanting to discuss loop recorder transmissions as patient felt extremely tired yesterday and took an evening nap which is out of patient's norm. 02/11/21 transmission reviewed and no new events noted that would cause "tiredness" yesterday. Discussed loop recorder patient specific programming with wife and informed that loop would not pick up additional variables not within device settings and provided reassurance that device was programmed appropriately for diagnosis of syncope. Presenting rythem today at 7:56 am SB@50 . Patient is not on cardiac medications that would cause bradycardia. Patient was sitting at the time of transmission. Discussed nocturnal bradycardia. Patient prescribed CPAP last year and compliant nightly however is still not sleeping well. Encouraged patient to contact sleep medicine provider for additional concerns. Wife appreciative of information. Will continue to monitor transmissions.

## 2021-02-14 NOTE — Telephone Encounter (Signed)
   Pt's wife calling back to follow up Pt's HR. She said pt is still having the same symptoms, she would like to speak with a nurse

## 2021-02-14 NOTE — Telephone Encounter (Signed)
Linq2 Transmission from this morning, reflects no new data to explain patient symptoms.

## 2021-02-14 NOTE — Telephone Encounter (Signed)
Returned call to Pt's wife.  Wife is concerned that Pt's heart rate is low and he is not receiving a pacemaker.  Questioned wife if Pt was having any new symptoms: syncope, dizziness, since last office visit with Dr. Lovena Le.  Wife states Pt has been tired.  The fatigue had started this week.    Explained that bradycardia in the absence of symptoms does not require a person to have a pacemaker.  Advised that pacemaker implant comes with certain risks.  And that at this time, Dr. Lovena Le does not feel Pt should move forward with pacemaker implant.  Wife then states that Pt's blood pressure has been high this week.  After further questioning:  wife is checking pt's blood pressure as soon as he gets home from work.  At that time it is high 155/88, 170/98.  Wife states Pt then takes dog for a walk and then after 20 minutes or so she rechecks his blood pressure and it is normal.  Advised wife this sounds like a normal blood pressure variant.  Wife is asking if Pt should see a primary cardiologist for blood pressure.  Advised if family would like a referral to primary cardiologist to let us know.  They will discuss and call back if needed.

## 2021-02-27 LAB — CUP PACEART REMOTE DEVICE CHECK
Date Time Interrogation Session: 20221108111441
Implantable Pulse Generator Implant Date: 20220321

## 2021-03-03 ENCOUNTER — Ambulatory Visit (INDEPENDENT_AMBULATORY_CARE_PROVIDER_SITE_OTHER): Payer: Medicare Other

## 2021-03-03 DIAGNOSIS — R55 Syncope and collapse: Secondary | ICD-10-CM

## 2021-03-11 NOTE — Progress Notes (Signed)
Carelink Summary Report / Loop Recorder 

## 2021-03-26 DIAGNOSIS — Z933 Colostomy status: Secondary | ICD-10-CM | POA: Diagnosis not present

## 2021-03-31 DIAGNOSIS — Z933 Colostomy status: Secondary | ICD-10-CM | POA: Diagnosis not present

## 2021-04-07 ENCOUNTER — Ambulatory Visit (INDEPENDENT_AMBULATORY_CARE_PROVIDER_SITE_OTHER): Payer: Medicare Other

## 2021-04-07 DIAGNOSIS — R55 Syncope and collapse: Secondary | ICD-10-CM | POA: Diagnosis not present

## 2021-04-07 LAB — CUP PACEART REMOTE DEVICE CHECK
Date Time Interrogation Session: 20221217230643
Implantable Pulse Generator Implant Date: 20220321

## 2021-04-17 NOTE — Progress Notes (Signed)
Carelink Summary Report / Loop Recorder 

## 2021-05-09 DIAGNOSIS — R7301 Impaired fasting glucose: Secondary | ICD-10-CM | POA: Diagnosis not present

## 2021-05-09 DIAGNOSIS — Z23 Encounter for immunization: Secondary | ICD-10-CM | POA: Diagnosis not present

## 2021-05-09 DIAGNOSIS — E782 Mixed hyperlipidemia: Secondary | ICD-10-CM | POA: Diagnosis not present

## 2021-05-09 DIAGNOSIS — Z0000 Encounter for general adult medical examination without abnormal findings: Secondary | ICD-10-CM | POA: Diagnosis not present

## 2021-05-09 DIAGNOSIS — R5383 Other fatigue: Secondary | ICD-10-CM | POA: Diagnosis not present

## 2021-05-12 ENCOUNTER — Ambulatory Visit (INDEPENDENT_AMBULATORY_CARE_PROVIDER_SITE_OTHER): Payer: PPO

## 2021-05-12 DIAGNOSIS — R55 Syncope and collapse: Secondary | ICD-10-CM

## 2021-05-12 LAB — CUP PACEART REMOTE DEVICE CHECK
Date Time Interrogation Session: 20230122230254
Implantable Pulse Generator Implant Date: 20220321

## 2021-05-16 DIAGNOSIS — C189 Malignant neoplasm of colon, unspecified: Secondary | ICD-10-CM | POA: Diagnosis not present

## 2021-05-16 DIAGNOSIS — Z933 Colostomy status: Secondary | ICD-10-CM | POA: Diagnosis not present

## 2021-05-16 DIAGNOSIS — N182 Chronic kidney disease, stage 2 (mild): Secondary | ICD-10-CM | POA: Diagnosis not present

## 2021-05-16 DIAGNOSIS — R7301 Impaired fasting glucose: Secondary | ICD-10-CM | POA: Diagnosis not present

## 2021-05-16 DIAGNOSIS — E782 Mixed hyperlipidemia: Secondary | ICD-10-CM | POA: Diagnosis not present

## 2021-05-16 DIAGNOSIS — Z23 Encounter for immunization: Secondary | ICD-10-CM | POA: Diagnosis not present

## 2021-05-16 DIAGNOSIS — R600 Localized edema: Secondary | ICD-10-CM | POA: Diagnosis not present

## 2021-05-16 DIAGNOSIS — Z0000 Encounter for general adult medical examination without abnormal findings: Secondary | ICD-10-CM | POA: Diagnosis not present

## 2021-05-23 NOTE — Progress Notes (Signed)
Carelink Summary Report / Loop Recorder 

## 2021-06-09 DIAGNOSIS — Z933 Colostomy status: Secondary | ICD-10-CM | POA: Diagnosis not present

## 2021-06-15 LAB — CUP PACEART REMOTE DEVICE CHECK
Date Time Interrogation Session: 20230224230541
Implantable Pulse Generator Implant Date: 20220321

## 2021-06-16 ENCOUNTER — Ambulatory Visit (INDEPENDENT_AMBULATORY_CARE_PROVIDER_SITE_OTHER): Payer: PPO

## 2021-06-16 DIAGNOSIS — R55 Syncope and collapse: Secondary | ICD-10-CM | POA: Diagnosis not present

## 2021-06-23 NOTE — Progress Notes (Signed)
Carelink Summary Report / Loop Recorder 

## 2021-07-21 ENCOUNTER — Ambulatory Visit (INDEPENDENT_AMBULATORY_CARE_PROVIDER_SITE_OTHER): Payer: PPO

## 2021-07-21 DIAGNOSIS — R55 Syncope and collapse: Secondary | ICD-10-CM

## 2021-07-22 LAB — CUP PACEART REMOTE DEVICE CHECK
Date Time Interrogation Session: 20230331230535
Implantable Pulse Generator Implant Date: 20220321

## 2021-07-25 DIAGNOSIS — G4733 Obstructive sleep apnea (adult) (pediatric): Secondary | ICD-10-CM | POA: Diagnosis not present

## 2021-08-05 NOTE — Progress Notes (Signed)
Carelink Summary Report / Loop Recorder 

## 2021-08-06 DIAGNOSIS — X32XXXD Exposure to sunlight, subsequent encounter: Secondary | ICD-10-CM | POA: Diagnosis not present

## 2021-08-06 DIAGNOSIS — L570 Actinic keratosis: Secondary | ICD-10-CM | POA: Diagnosis not present

## 2021-08-21 DIAGNOSIS — Z933 Colostomy status: Secondary | ICD-10-CM | POA: Diagnosis not present

## 2021-08-21 LAB — CUP PACEART REMOTE DEVICE CHECK
Date Time Interrogation Session: 20230503230536
Implantable Pulse Generator Implant Date: 20220321

## 2021-08-25 ENCOUNTER — Ambulatory Visit (INDEPENDENT_AMBULATORY_CARE_PROVIDER_SITE_OTHER): Payer: PPO

## 2021-08-25 DIAGNOSIS — R55 Syncope and collapse: Secondary | ICD-10-CM | POA: Diagnosis not present

## 2021-09-12 NOTE — Progress Notes (Signed)
Carelink Summary Report / Loop Recorder 

## 2021-09-18 DIAGNOSIS — Z933 Colostomy status: Secondary | ICD-10-CM | POA: Diagnosis not present

## 2021-10-26 DIAGNOSIS — H61891 Other specified disorders of right external ear: Secondary | ICD-10-CM | POA: Diagnosis not present

## 2021-10-30 LAB — CUP PACEART REMOTE DEVICE CHECK
Date Time Interrogation Session: 20230708230623
Implantable Pulse Generator Implant Date: 20220321

## 2021-11-03 ENCOUNTER — Ambulatory Visit (INDEPENDENT_AMBULATORY_CARE_PROVIDER_SITE_OTHER): Payer: Medicare Other

## 2021-11-03 DIAGNOSIS — R55 Syncope and collapse: Secondary | ICD-10-CM

## 2021-12-04 DIAGNOSIS — E782 Mixed hyperlipidemia: Secondary | ICD-10-CM | POA: Diagnosis not present

## 2021-12-04 DIAGNOSIS — Z933 Colostomy status: Secondary | ICD-10-CM | POA: Diagnosis not present

## 2021-12-04 DIAGNOSIS — Z0000 Encounter for general adult medical examination without abnormal findings: Secondary | ICD-10-CM | POA: Diagnosis not present

## 2021-12-04 DIAGNOSIS — C189 Malignant neoplasm of colon, unspecified: Secondary | ICD-10-CM | POA: Diagnosis not present

## 2021-12-04 DIAGNOSIS — R7301 Impaired fasting glucose: Secondary | ICD-10-CM | POA: Diagnosis not present

## 2021-12-04 DIAGNOSIS — N182 Chronic kidney disease, stage 2 (mild): Secondary | ICD-10-CM | POA: Diagnosis not present

## 2021-12-04 NOTE — Progress Notes (Signed)
Carelink Summary Report / Loop Recorder 

## 2021-12-08 ENCOUNTER — Ambulatory Visit (INDEPENDENT_AMBULATORY_CARE_PROVIDER_SITE_OTHER): Payer: PPO

## 2021-12-08 DIAGNOSIS — R55 Syncope and collapse: Secondary | ICD-10-CM

## 2021-12-10 LAB — CUP PACEART REMOTE DEVICE CHECK
Date Time Interrogation Session: 20230823081711
Implantable Pulse Generator Implant Date: 20220321

## 2021-12-29 ENCOUNTER — Encounter: Payer: PPO | Admitting: Student

## 2021-12-31 ENCOUNTER — Encounter: Payer: Self-pay | Admitting: Student

## 2021-12-31 ENCOUNTER — Ambulatory Visit: Payer: PPO | Attending: Student | Admitting: Student

## 2021-12-31 VITALS — BP 126/74 | HR 63 | Ht 70.0 in | Wt 248.4 lb

## 2021-12-31 DIAGNOSIS — R55 Syncope and collapse: Secondary | ICD-10-CM

## 2021-12-31 NOTE — Patient Instructions (Signed)
Medication Instructions:  Your physician recommends that you continue on your current medications as directed. Please refer to the Current Medication list given to you today.  *If you need a refill on your cardiac medications before your next appointment, please call your pharmacy*   Lab Work: None  If you have labs (blood work) drawn today and your tests are completely normal, you will receive your results only by: [place] (if you have MyChart) OR A paper copy in the mail If you have any lab test that is abnormal or we need to change your treatment, we will call you to review the results.   Follow-Up: At [facility], you and your health needs are our priority.  As part of our continuing mission to provide you with exceptional heart care, we have created designated Provider Care Teams.  These Care Teams include your primary Cardiologist (physician) and Advanced Practice Providers (APPs -  Physician Assistants and Nurse Practitioners) who all work together to provide you with the care you need, when you need it.   Your next appointment:   1 year(s)  The format for your next appointment:   In Person  Provider:   Cristopher Peru, MD

## 2021-12-31 NOTE — Progress Notes (Signed)
[name], DOB 04/14/1951, MRN [id number]  PCP: Merrilee Seashore, MD Primary Cardiologist: None Electrophysiologist: Cristopher Peru, MD   CC: ILR follow-up  [name] is a 71 y.o. male seen today for Dr. Lovena Le . he presents today for routine Justin followup.  last being seen in our clinic the patient reports doing very well. He has a CPAP, but uses it only several times a week.  he denies chest pain, palpitations, dyspnea, PND, orthopnea, nausea, vomiting, dizziness, syncope, edema, weight gain, or early satiety. .  Device History: Medtronic loop recorder implanted 2022 for syncope  Past Medical History:  Diagnosis Date   Arthritis    BPH (benign prostatic hyperplasia)    Colon polyp    HLD (hyperlipidemia)    Obstructive sleep apnea 06/06/2020   Rectal cancer ([place])    Vasovagal syncope    dx years ago , reprots " now i sometimes pass out when i get my blood drawn, but now i just get sweaty"    Past Surgical History:  Procedure Laterality Date   COLONOSCOPY  2013   INCISION AND DRAINAGE OF WOUND N/A 01/05/2019   Procedure: Excision of Peristomal skin mass;  Surgeon: Ileana Roup, MD;  Location: [facility];  Service: General;  Laterality: N/A;   LOOP RECORDER INSERTION N/A 07/08/2020   Procedure: LOOP RECORDER INSERTION;  Surgeon: Evans Lance, MD;  Location: [facility];  Service: Cardiovascular;  Laterality: N/A;   POLYPECTOMY  08-28-11   3 polyps   RECTAL SURGERY     with colostomy    Current Outpatient Medications  Medication Sig Dispense Refill   atorvastatin (LIPITOR) 20 MG tablet Take 20 mg by mouth at bedtime.     Multiple Vitamin (MULTIVITAMIN) tablet Take 1 tablet by mouth daily.     pantoprazole (PROTONIX) 40 MG tablet Take 40 mg by mouth at bedtime as needed (for heartburn or reflux).     tamsulosin (FLOMAX) 0.4 MG CAPS capsule TAKE 1 CAPSULE BY MOUTH EVERY DAY FOR 30 DAYS      No current facility-administered medications for this visit.    Allergies:   Patient has no known allergies.   Social History: Social History   Socioeconomic History   Marital status: Married    Spouse name: Not on file   Number of children: 1   Years of education: `   Highest education level: Not on file  Occupational History   Occupation: Chief Financial Officer  Tobacco Use   Smoking status: Former   Smokeless tobacco: Never  Scientific laboratory technician Use: Never used  Substance and Sexual Activity   Alcohol use: Yes    Alcohol/week: 1.0 standard drink of alcohol    Types: 1 Standard drinks or equivalent per week    Comment: socially   Drug use: No   Sexual activity: Not on file  Other Topics Concern   Not on file  Social History Narrative   2 CUPS OF COFFEE A DAY   Social Determinants of Health   Financial Resource Strain: Not on file  Food Insecurity: Not on file  Transportation Needs: Not on file  Physical Activity: Not on file  Stress: Not on file  Social Connections: Not on file  Intimate Partner Violence: Not on file    Family History: Family History  Problem Relation Age of Onset   Ovarian cancer Mother    Breast cancer Mother  Hyperlipidemia Father        mother   Stomach cancer Neg Hx    Colon cancer Neg Hx    Esophageal cancer Neg Hx    Prostate cancer Neg Hx    Rectal cancer Neg Hx      Review of Systems: All other systems reviewed and are otherwise negative except as noted above.  Physical Exam: Vitals:   12/31/21 0757  BP: 126/74  Pulse: 63  SpO2: 93%  Weight: 248 lb 6.4 oz (112.7 kg)  Height: '5\' 10"'$  (1.778 m)     GEN- The patient is well appearing, alert and oriented x 3 today.   HEENT: normocephalic, atraumatic; sclera clear, conjunctiva pink; hearing intact; oropharynx clear; neck supple  Lungs- Clear to ausculation bilaterally, normal work of breathing.  No wheezes, rales, rhonchi Heart- Regular rate and rhythm, no murmurs, rubs or  gallops  GI- soft, non-tender, non-distended, bowel sounds present  Extremities- no clubbing, cyanosis, or edema  MS- no significant deformity or atrophy Skin- warm and dry, no rash or lesion; ILR pocket well healed Psych- euthymic mood, full affect Neuro- strength and sensation are intact  PPM Interrogation- reviewed in detail today,  See PACEART report  EKG:  EKG is ordered today. The ekg ordered today shows NSR at 63 bpm with a  PAC  Recent Labs: No results found for requested labs within last 365 days.   Wt Readings from Last 3 Encounters:  12/31/21 248 lb 6.4 oz (112.7 kg)  11/01/20 248 lb (112.5 kg)  09/10/20 242 lb 12.8 oz (110.1 kg)     Other studies Reviewed: Additional studies/ records that were reviewed today include:revious EP office notes, Previous remote checks, Most recent labwork.   Assessment and Plan:  1. Syncope s/p Medtronic Loop recorder Normal device function See Pace Art report No changes today Remotes had paused as App was closed; restarted in office. No symptoms. Will follow up if episodes pushed through.   Current medicines are reviewed at length with the patient today.   The patient has concerns regarding his medicines.  The following changes were made today:  none  Labs/ tests ordered today include:  Orders Placed This Encounter  Procedures   EKG 12-Lead     Disposition:   Follow up with Dr. Lovena Le  in 12 Months    Signed, Shirley Friar, PA-C  12/31/2021 8:14 AM  [facility] [address] [place] [address] [phone] (office) [phone] (fax)

## 2022-01-01 DIAGNOSIS — N182 Chronic kidney disease, stage 2 (mild): Secondary | ICD-10-CM | POA: Diagnosis not present

## 2022-01-01 DIAGNOSIS — Z933 Colostomy status: Secondary | ICD-10-CM | POA: Diagnosis not present

## 2022-01-01 DIAGNOSIS — E782 Mixed hyperlipidemia: Secondary | ICD-10-CM | POA: Diagnosis not present

## 2022-01-01 DIAGNOSIS — R600 Localized edema: Secondary | ICD-10-CM | POA: Diagnosis not present

## 2022-01-01 DIAGNOSIS — R7301 Impaired fasting glucose: Secondary | ICD-10-CM | POA: Diagnosis not present

## 2022-01-05 NOTE — Progress Notes (Signed)
Carelink Summary Report / Loop Recorder 

## 2022-01-12 ENCOUNTER — Ambulatory Visit (INDEPENDENT_AMBULATORY_CARE_PROVIDER_SITE_OTHER): Payer: PPO

## 2022-01-12 DIAGNOSIS — R55 Syncope and collapse: Secondary | ICD-10-CM | POA: Diagnosis not present

## 2022-01-12 DIAGNOSIS — Z933 Colostomy status: Secondary | ICD-10-CM | POA: Diagnosis not present

## 2022-01-13 LAB — CUP PACEART REMOTE DEVICE CHECK
Date Time Interrogation Session: 20230922230347
Implantable Pulse Generator Implant Date: 20220321

## 2022-01-27 NOTE — Progress Notes (Signed)
Carelink Summary Report / Loop Recorder 

## 2022-02-16 ENCOUNTER — Ambulatory Visit (INDEPENDENT_AMBULATORY_CARE_PROVIDER_SITE_OTHER): Payer: PPO

## 2022-02-16 DIAGNOSIS — R55 Syncope and collapse: Secondary | ICD-10-CM

## 2022-02-17 LAB — CUP PACEART REMOTE DEVICE CHECK
Date Time Interrogation Session: 20231025230504
Implantable Pulse Generator Implant Date: 20220321

## 2022-02-26 IMAGING — DX DG CHEST 2V
3 series · 3 of 3 positions shown · non-contrast
Comparison: Chest CT December 06, 2019

CLINICAL DATA: Syncope.

EXAM:
CHEST - 2 VIEW

[chest lat (1 of 2)]
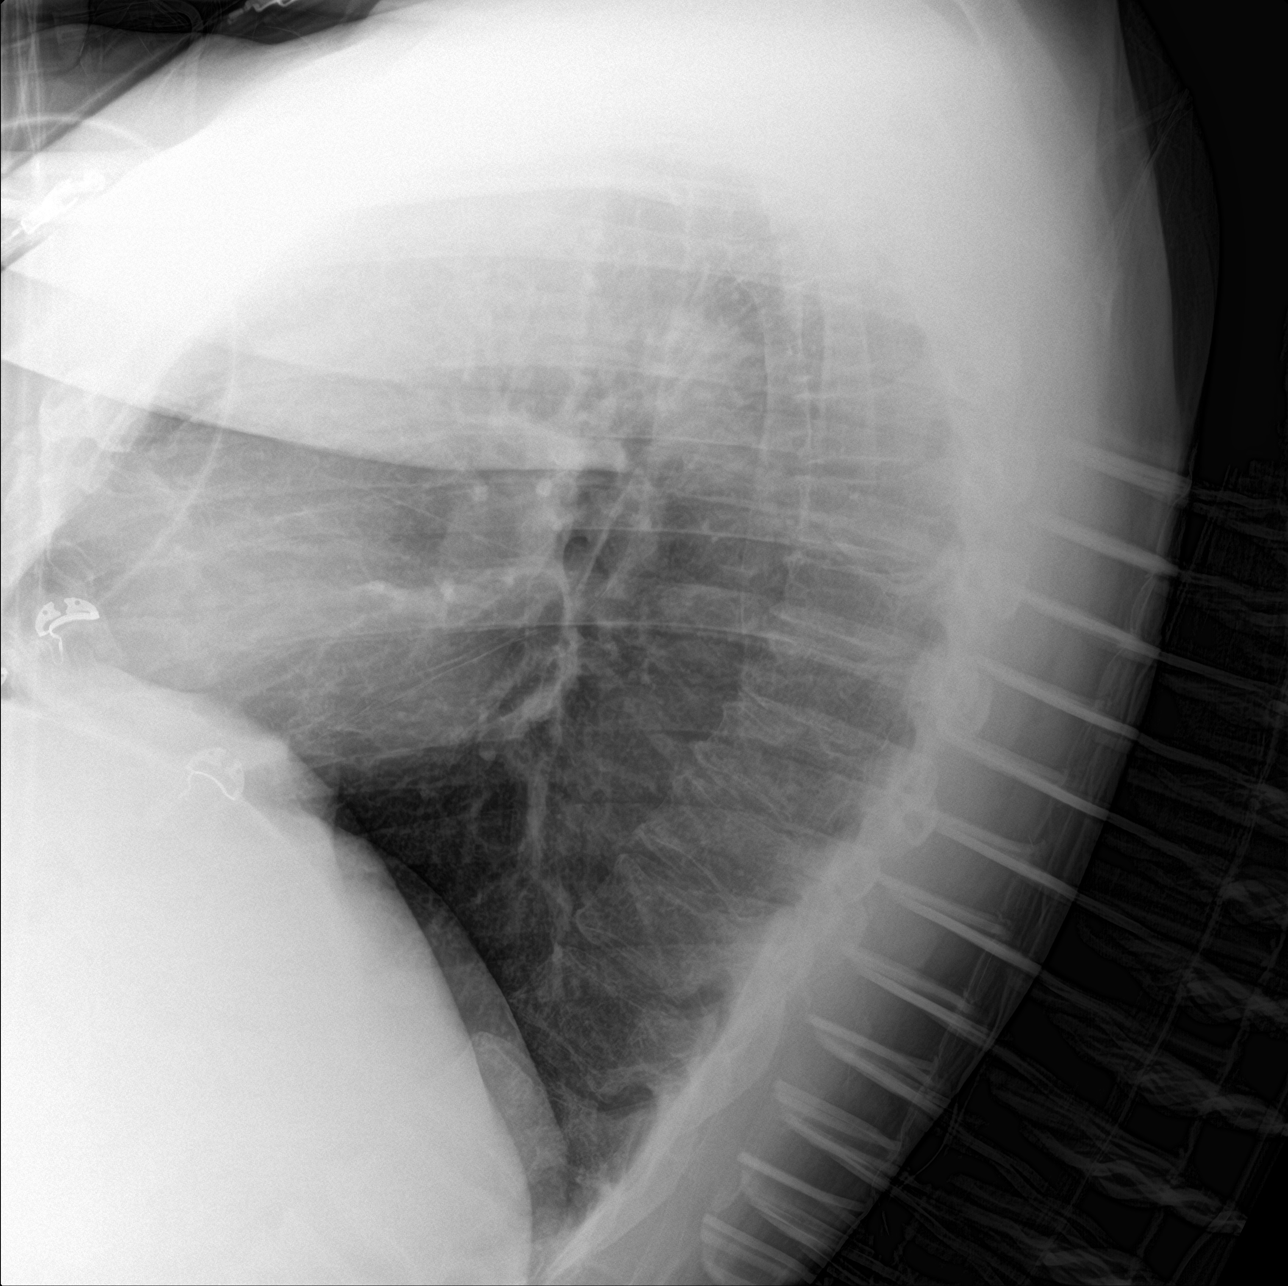

[chest ap strecther]
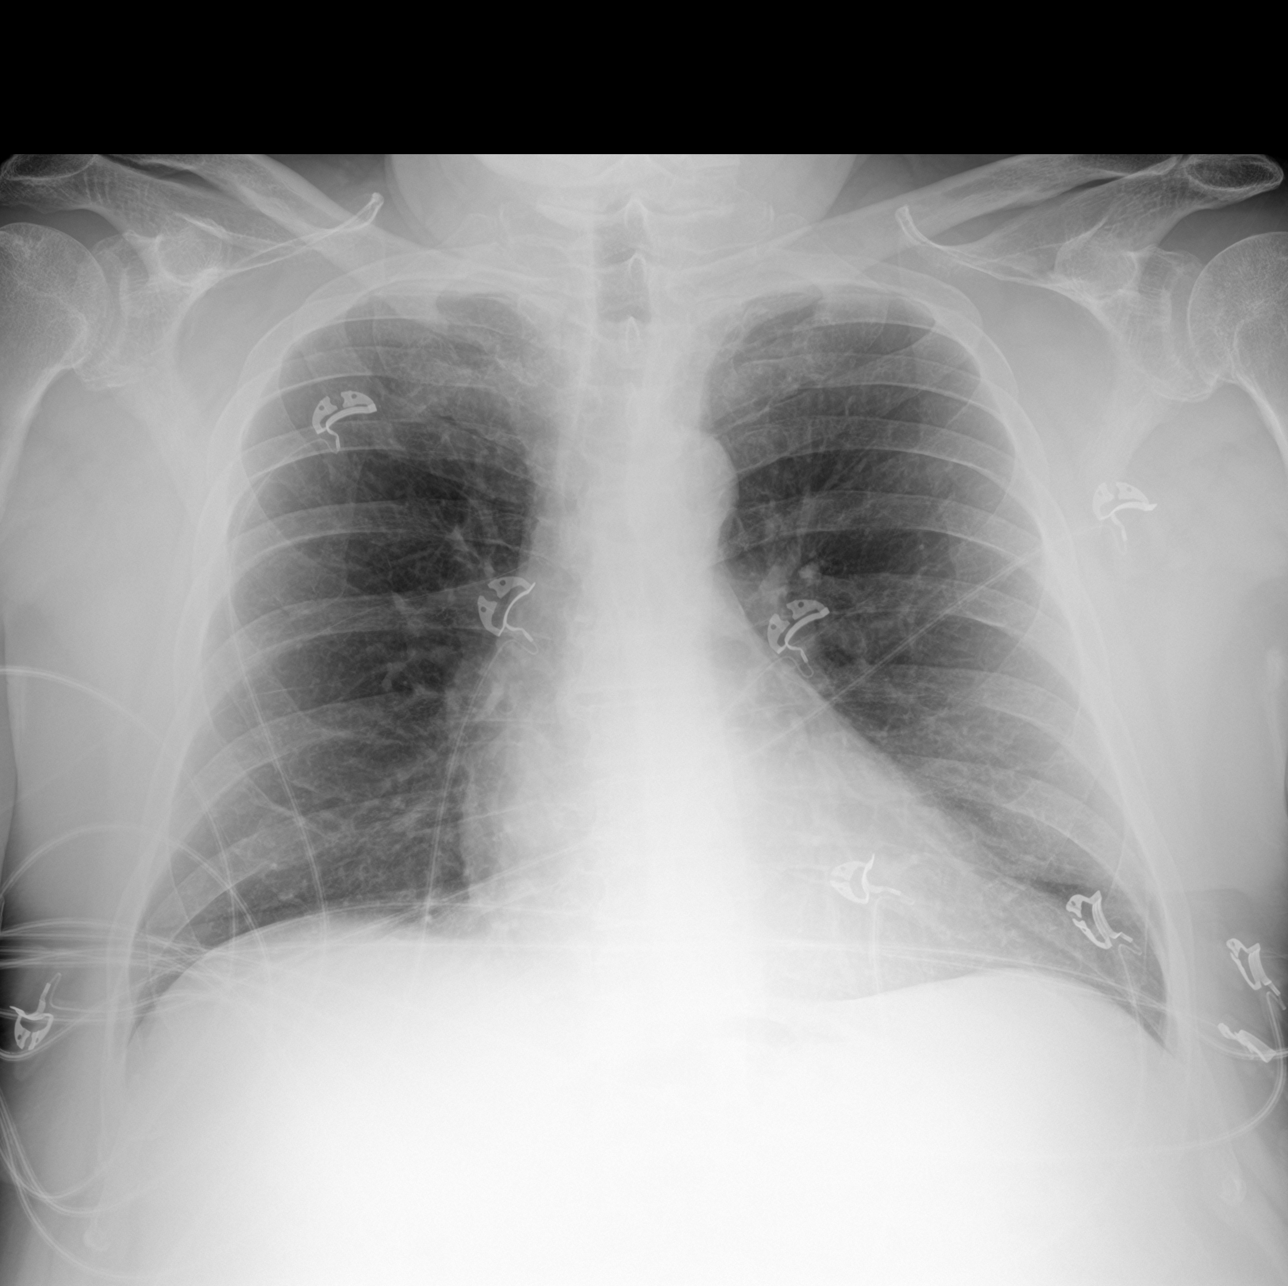

[chest lat (2 of 2)]
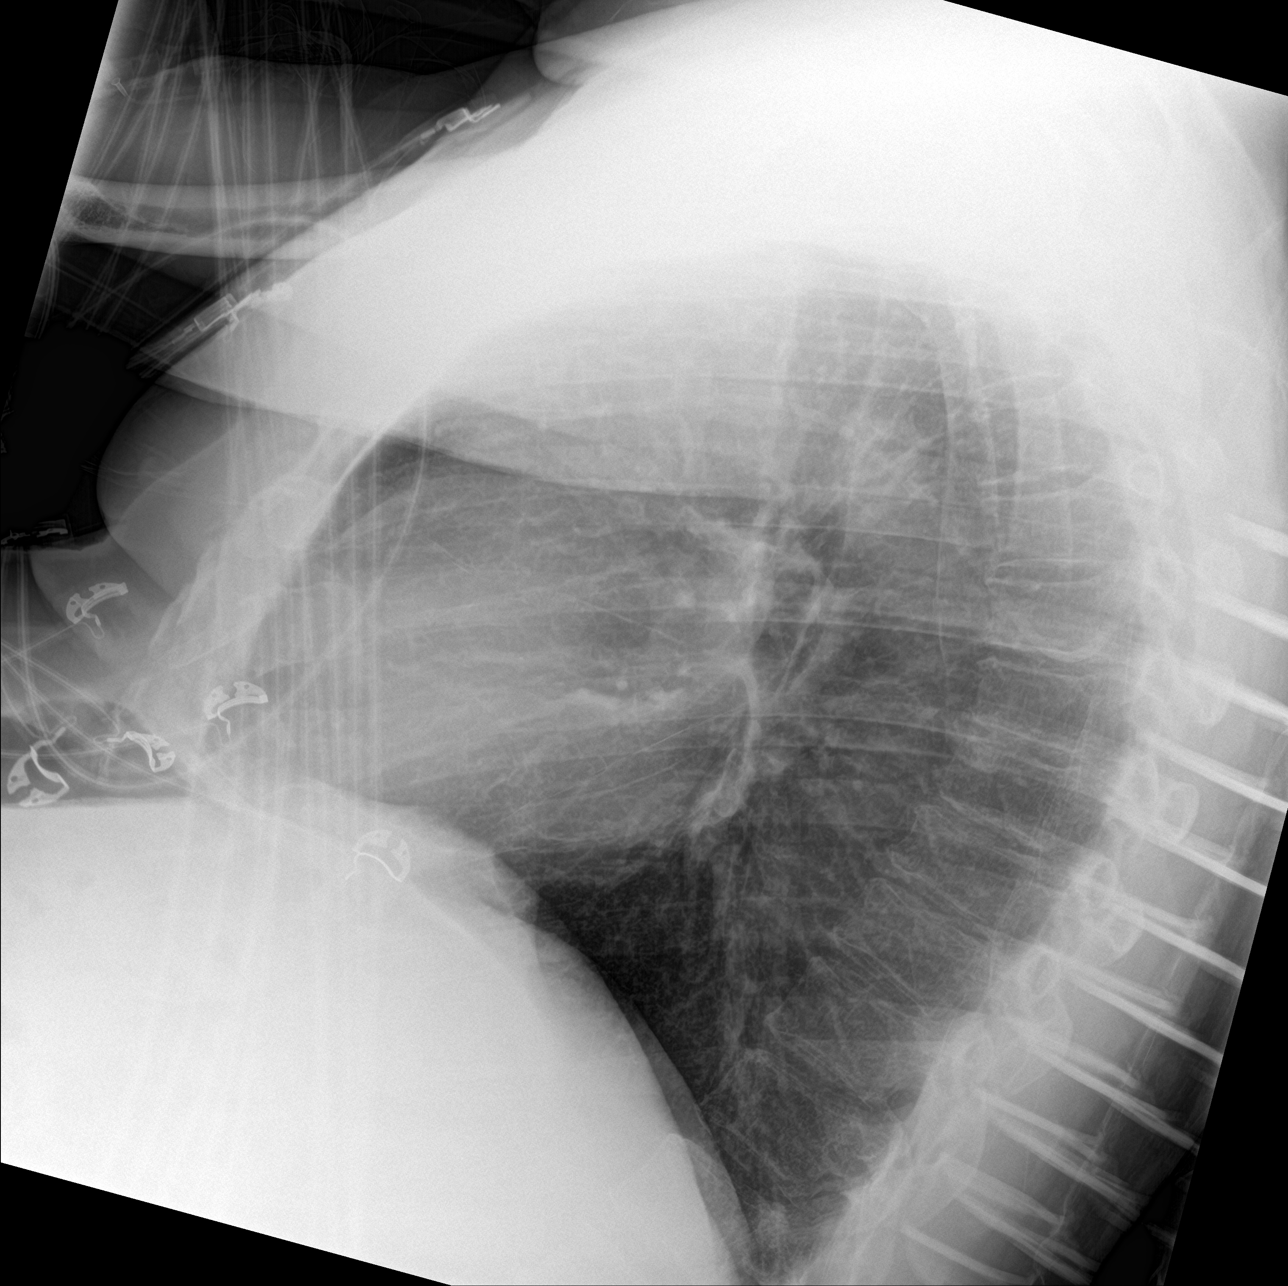

[3 of 3 positions shown; findings below may reference images not displayed]

FINDINGS: The heart size and mediastinal contours are within normal limits.
Both lungs are clear. The visualized skeletal structures are
unremarkable.
IMPRESSION: No active cardiopulmonary disease.

## 2022-03-02 DIAGNOSIS — M2041 Other hammer toe(s) (acquired), right foot: Secondary | ICD-10-CM | POA: Diagnosis not present

## 2022-03-02 DIAGNOSIS — M79674 Pain in right toe(s): Secondary | ICD-10-CM | POA: Diagnosis not present

## 2022-03-02 DIAGNOSIS — M79675 Pain in left toe(s): Secondary | ICD-10-CM | POA: Diagnosis not present

## 2022-03-02 DIAGNOSIS — M2042 Other hammer toe(s) (acquired), left foot: Secondary | ICD-10-CM | POA: Diagnosis not present

## 2022-03-19 DIAGNOSIS — Z933 Colostomy status: Secondary | ICD-10-CM | POA: Diagnosis not present

## 2022-03-21 NOTE — Progress Notes (Signed)
Carelink Summary Report / Loop Recorder 

## 2022-03-23 ENCOUNTER — Ambulatory Visit (INDEPENDENT_AMBULATORY_CARE_PROVIDER_SITE_OTHER): Payer: PPO

## 2022-03-23 DIAGNOSIS — R55 Syncope and collapse: Secondary | ICD-10-CM | POA: Diagnosis not present

## 2022-03-23 LAB — CUP PACEART REMOTE DEVICE CHECK
Date Time Interrogation Session: 20231203230806
Implantable Pulse Generator Implant Date: 20220321

## 2022-04-27 ENCOUNTER — Ambulatory Visit (INDEPENDENT_AMBULATORY_CARE_PROVIDER_SITE_OTHER): Payer: PPO

## 2022-04-27 DIAGNOSIS — R55 Syncope and collapse: Secondary | ICD-10-CM | POA: Diagnosis not present

## 2022-04-28 DIAGNOSIS — R058 Other specified cough: Secondary | ICD-10-CM | POA: Diagnosis not present

## 2022-04-28 LAB — CUP PACEART REMOTE DEVICE CHECK
Date Time Interrogation Session: 20240107230833
Implantable Pulse Generator Implant Date: 20220321

## 2022-04-30 NOTE — Progress Notes (Signed)
Carelink Summary Report / Loop Recorder 

## 2022-05-14 DIAGNOSIS — R053 Chronic cough: Secondary | ICD-10-CM | POA: Diagnosis not present

## 2022-05-31 LAB — CUP PACEART REMOTE DEVICE CHECK
Date Time Interrogation Session: 20240209230611
Implantable Pulse Generator Implant Date: 20220321

## 2022-06-01 ENCOUNTER — Ambulatory Visit: Payer: PPO

## 2022-06-01 DIAGNOSIS — R55 Syncope and collapse: Secondary | ICD-10-CM

## 2022-06-02 NOTE — Progress Notes (Signed)
Carelink Summary Report / Loop Recorder 

## 2022-06-04 DIAGNOSIS — N400 Enlarged prostate without lower urinary tract symptoms: Secondary | ICD-10-CM | POA: Diagnosis not present

## 2022-06-04 DIAGNOSIS — N182 Chronic kidney disease, stage 2 (mild): Secondary | ICD-10-CM | POA: Diagnosis not present

## 2022-06-04 DIAGNOSIS — R600 Localized edema: Secondary | ICD-10-CM | POA: Diagnosis not present

## 2022-06-04 DIAGNOSIS — Z125 Encounter for screening for malignant neoplasm of prostate: Secondary | ICD-10-CM | POA: Diagnosis not present

## 2022-06-04 DIAGNOSIS — R7301 Impaired fasting glucose: Secondary | ICD-10-CM | POA: Diagnosis not present

## 2022-06-04 DIAGNOSIS — E782 Mixed hyperlipidemia: Secondary | ICD-10-CM | POA: Diagnosis not present

## 2022-06-04 DIAGNOSIS — Z0000 Encounter for general adult medical examination without abnormal findings: Secondary | ICD-10-CM | POA: Diagnosis not present

## 2022-06-24 DIAGNOSIS — C189 Malignant neoplasm of colon, unspecified: Secondary | ICD-10-CM | POA: Diagnosis not present

## 2022-06-24 DIAGNOSIS — Z0000 Encounter for general adult medical examination without abnormal findings: Secondary | ICD-10-CM | POA: Diagnosis not present

## 2022-06-24 DIAGNOSIS — N182 Chronic kidney disease, stage 2 (mild): Secondary | ICD-10-CM | POA: Diagnosis not present

## 2022-06-24 DIAGNOSIS — R600 Localized edema: Secondary | ICD-10-CM | POA: Diagnosis not present

## 2022-06-24 DIAGNOSIS — R7303 Prediabetes: Secondary | ICD-10-CM | POA: Diagnosis not present

## 2022-06-24 DIAGNOSIS — E782 Mixed hyperlipidemia: Secondary | ICD-10-CM | POA: Diagnosis not present

## 2022-06-24 DIAGNOSIS — Z933 Colostomy status: Secondary | ICD-10-CM | POA: Diagnosis not present

## 2022-06-24 DIAGNOSIS — N400 Enlarged prostate without lower urinary tract symptoms: Secondary | ICD-10-CM | POA: Diagnosis not present

## 2022-06-24 DIAGNOSIS — K219 Gastro-esophageal reflux disease without esophagitis: Secondary | ICD-10-CM | POA: Diagnosis not present

## 2022-07-06 ENCOUNTER — Ambulatory Visit (INDEPENDENT_AMBULATORY_CARE_PROVIDER_SITE_OTHER): Payer: PPO

## 2022-07-06 DIAGNOSIS — R55 Syncope and collapse: Secondary | ICD-10-CM

## 2022-07-07 LAB — CUP PACEART REMOTE DEVICE CHECK
Date Time Interrogation Session: 20240317231239
Implantable Pulse Generator Implant Date: 20220321

## 2022-07-15 NOTE — Progress Notes (Signed)
Carelink Summary Report / Loop Recorder 

## 2022-08-10 ENCOUNTER — Ambulatory Visit (INDEPENDENT_AMBULATORY_CARE_PROVIDER_SITE_OTHER): Payer: PPO

## 2022-08-10 DIAGNOSIS — R55 Syncope and collapse: Secondary | ICD-10-CM

## 2022-08-10 LAB — CUP PACEART REMOTE DEVICE CHECK
Date Time Interrogation Session: 20240419230546
Implantable Pulse Generator Implant Date: 20220321

## 2022-08-13 DIAGNOSIS — Z933 Colostomy status: Secondary | ICD-10-CM | POA: Diagnosis not present

## 2022-08-17 NOTE — Progress Notes (Signed)
Carelink Summary Report / Loop Recorder 

## 2022-09-10 LAB — CUP PACEART REMOTE DEVICE CHECK
Date Time Interrogation Session: 20240522230153
Implantable Pulse Generator Implant Date: 20220321

## 2022-09-15 ENCOUNTER — Ambulatory Visit (INDEPENDENT_AMBULATORY_CARE_PROVIDER_SITE_OTHER): Payer: PPO

## 2022-09-15 DIAGNOSIS — R55 Syncope and collapse: Secondary | ICD-10-CM | POA: Diagnosis not present

## 2022-09-16 DIAGNOSIS — G4733 Obstructive sleep apnea (adult) (pediatric): Secondary | ICD-10-CM | POA: Diagnosis not present

## 2022-09-16 NOTE — Progress Notes (Signed)
Carelink Summary Report / Loop Recorder 

## 2022-10-12 NOTE — Progress Notes (Signed)
Carelink Summary Report / Loop Recorder 

## 2022-10-13 LAB — CUP PACEART REMOTE DEVICE CHECK
Date Time Interrogation Session: 20240624230018
Implantable Pulse Generator Implant Date: 20220321

## 2022-10-19 ENCOUNTER — Ambulatory Visit (INDEPENDENT_AMBULATORY_CARE_PROVIDER_SITE_OTHER): Payer: PPO

## 2022-10-19 DIAGNOSIS — R55 Syncope and collapse: Secondary | ICD-10-CM

## 2022-10-21 DIAGNOSIS — Z933 Colostomy status: Secondary | ICD-10-CM | POA: Diagnosis not present

## 2022-11-09 NOTE — Progress Notes (Signed)
Carelink Summary Report / Loop Recorder 

## 2022-11-23 ENCOUNTER — Ambulatory Visit: Payer: Medicare Other

## 2022-11-23 DIAGNOSIS — R55 Syncope and collapse: Secondary | ICD-10-CM

## 2022-12-08 NOTE — Progress Notes (Signed)
Carelink Summary Report / Loop Recorder 

## 2022-12-28 ENCOUNTER — Ambulatory Visit (INDEPENDENT_AMBULATORY_CARE_PROVIDER_SITE_OTHER): Payer: PPO

## 2022-12-28 DIAGNOSIS — R55 Syncope and collapse: Secondary | ICD-10-CM

## 2022-12-28 LAB — CUP PACEART REMOTE DEVICE CHECK
Date Time Interrogation Session: 20240906230620
Implantable Pulse Generator Implant Date: 20220321

## 2022-12-31 DIAGNOSIS — R7303 Prediabetes: Secondary | ICD-10-CM | POA: Diagnosis not present

## 2022-12-31 DIAGNOSIS — R600 Localized edema: Secondary | ICD-10-CM | POA: Diagnosis not present

## 2022-12-31 DIAGNOSIS — E782 Mixed hyperlipidemia: Secondary | ICD-10-CM | POA: Diagnosis not present

## 2022-12-31 DIAGNOSIS — N182 Chronic kidney disease, stage 2 (mild): Secondary | ICD-10-CM | POA: Diagnosis not present

## 2023-01-14 NOTE — Progress Notes (Signed)
Carelink Summary Report / Loop Recorder 

## 2023-02-01 ENCOUNTER — Ambulatory Visit (INDEPENDENT_AMBULATORY_CARE_PROVIDER_SITE_OTHER): Payer: PPO

## 2023-02-01 DIAGNOSIS — R55 Syncope and collapse: Secondary | ICD-10-CM | POA: Diagnosis not present

## 2023-02-02 LAB — CUP PACEART REMOTE DEVICE CHECK
Date Time Interrogation Session: 20241013230528
Implantable Pulse Generator Implant Date: 20220321

## 2023-02-05 DIAGNOSIS — G4733 Obstructive sleep apnea (adult) (pediatric): Secondary | ICD-10-CM | POA: Diagnosis not present

## 2023-02-16 NOTE — Progress Notes (Signed)
Carelink Summary Report / Loop Recorder 

## 2023-02-24 ENCOUNTER — Ambulatory Visit: Payer: PPO | Attending: Internal Medicine | Admitting: Internal Medicine

## 2023-02-24 ENCOUNTER — Encounter: Payer: Self-pay | Admitting: Internal Medicine

## 2023-02-24 VITALS — BP 112/72 | HR 75 | Ht 70.0 in | Wt 252.0 lb

## 2023-02-24 DIAGNOSIS — R55 Syncope and collapse: Secondary | ICD-10-CM | POA: Diagnosis not present

## 2023-02-24 NOTE — Patient Instructions (Signed)

## 2023-02-24 NOTE — Progress Notes (Signed)
HPI [name] returns today for ongoing evaluation and management of syncope. He is a pleasant 72 yo man with a h/o sinus bradycardia and syncope. He has undergone insertion of an ILR. He has not had recurrent syncope. He has had some sinus brady on his ILR into the 30's but was asymptomatic. He is able to walk without limit and over all feels well.  He is about to retire again. He feels well.     No Known Allergies   Current Outpatient Medications  Medication Sig Dispense Refill   atorvastatin (LIPITOR) 20 MG tablet Take 20 mg by mouth at bedtime.     Multiple Vitamin (MULTIVITAMIN) tablet Take 1 tablet by mouth daily.     pantoprazole (PROTONIX) 40 MG tablet Take 40 mg by mouth at bedtime as needed (for heartburn or reflux).     tamsulosin (FLOMAX) 0.4 MG CAPS capsule TAKE 1 CAPSULE BY MOUTH EVERY DAY FOR 30 DAYS     No current facility-administered medications for this visit.     Past Medical History:  Diagnosis Date   Arthritis    BPH (benign prostatic hyperplasia)    Colon polyp    HLD (hyperlipidemia)    Obstructive sleep apnea 06/06/2020   Rectal cancer ([facility])    Vasovagal syncope    dx years ago , reprots " now i sometimes pass out when i get my blood drawn, but now i just get sweaty"     ROS:   All systems reviewed and negative except as noted in the HPI.   Past Surgical History:  Procedure Laterality Date   COLONOSCOPY  2013   INCISION AND DRAINAGE OF WOUND N/A 01/05/2019   Procedure: Excision of Peristomal skin mass;  Surgeon: Andria Meuse, MD;  Location: [facility];  Service: General;  Laterality: N/A;   LOOP RECORDER INSERTION N/A 07/08/2020   Procedure: LOOP RECORDER INSERTION;  Surgeon: Marinus Maw, MD;  Location: [facility];  Service: Cardiovascular;  Laterality: N/A;   POLYPECTOMY  08-28-11   3 polyps   RECTAL SURGERY     with colostomy     Family History  Problem Relation Age of Onset   Ovarian cancer Mother    Breast cancer  Mother    Hyperlipidemia Father        mother   Stomach cancer Neg Hx    Colon cancer Neg Hx    Esophageal cancer Neg Hx    Prostate cancer Neg Hx    Rectal cancer Neg Hx      Social History   Socioeconomic History   Marital status: Married    Spouse name: Not on file   Number of children: 1   Years of education: `   Highest education level: Not on file  Occupational History   Occupation: Art gallery manager  Tobacco Use   Smoking status: Former   Smokeless tobacco: Never  Vaping Use   Vaping status: Never Used  Substance and Sexual Activity   Alcohol use: Yes    Alcohol/week: 1.0 standard drink of alcohol    Types: 1 Standard drinks or equivalent per week    Comment: socially   Drug use: No   Sexual activity: Not on file  Other Topics Concern   Not on file  Social History Narrative   2 CUPS OF COFFEE A DAY   Social Determinants of Health   Financial Resource Strain: Not on file  Food Insecurity: Not on file  Transportation  Needs: Not on file  Physical Activity: Not on file  Stress: Not on file  Social Connections: Not on file  Intimate Partner Violence: Not on file     BP 112/72   Pulse 75   Ht 5\' 10"  (1.778 m)   Wt 252 lb (114.3 kg)   SpO2 94%   BMI 36.16 kg/m   Physical Exam:  Well appearing NAD HEENT: Unremarkable Neck:  No JVD, no thyromegally Lymphatics:  No adenopathy Back:  No CVA tenderness Lungs:  Clear with no wheezes HEART:  Regular rate rhythm, no murmurs, no rubs, no clicks Abd:  soft, positive bowel sounds, no organomegally, no rebound, no guarding Ext:  2 plus pulses, no edema, no cyanosis, no clubbing Skin:  No rashes no nodules Neuro:  CN II through XII intact, motor grossly intact  EKG - NSR with PAC's  DEVICE  Normal device function.  See PaceArt for details.   Assess/Plan: 1. Unexplained syncope - he is s/p ILR insertion with no symptomatic brady or tachycardia 2. HTN - his bp is controlled. No change in meds. 3. Obesity - he  is encouraged to lose weight.    Justin Gowda Triniti Gruetzmacher,MD

## 2023-02-25 DIAGNOSIS — N400 Enlarged prostate without lower urinary tract symptoms: Secondary | ICD-10-CM | POA: Diagnosis not present

## 2023-02-25 DIAGNOSIS — K219 Gastro-esophageal reflux disease without esophagitis: Secondary | ICD-10-CM | POA: Diagnosis not present

## 2023-02-25 DIAGNOSIS — E782 Mixed hyperlipidemia: Secondary | ICD-10-CM | POA: Diagnosis not present

## 2023-02-25 DIAGNOSIS — Z933 Colostomy status: Secondary | ICD-10-CM | POA: Diagnosis not present

## 2023-02-25 DIAGNOSIS — N182 Chronic kidney disease, stage 2 (mild): Secondary | ICD-10-CM | POA: Diagnosis not present

## 2023-02-25 DIAGNOSIS — Z23 Encounter for immunization: Secondary | ICD-10-CM | POA: Diagnosis not present

## 2023-02-25 DIAGNOSIS — R600 Localized edema: Secondary | ICD-10-CM | POA: Diagnosis not present

## 2023-03-08 ENCOUNTER — Ambulatory Visit (INDEPENDENT_AMBULATORY_CARE_PROVIDER_SITE_OTHER): Payer: PPO

## 2023-03-08 DIAGNOSIS — R55 Syncope and collapse: Secondary | ICD-10-CM | POA: Diagnosis not present

## 2023-03-08 LAB — CUP PACEART REMOTE DEVICE CHECK
Date Time Interrogation Session: 20241117230929
Implantable Pulse Generator Implant Date: 20220321

## 2023-04-01 NOTE — Progress Notes (Signed)
Carelink Summary Report / Loop Recorder 

## 2023-04-09 DIAGNOSIS — Z933 Colostomy status: Secondary | ICD-10-CM | POA: Diagnosis not present

## 2023-04-12 ENCOUNTER — Ambulatory Visit: Payer: PPO

## 2023-04-12 DIAGNOSIS — R55 Syncope and collapse: Secondary | ICD-10-CM

## 2023-04-12 LAB — CUP PACEART REMOTE DEVICE CHECK
Date Time Interrogation Session: 20241222230749
Implantable Pulse Generator Implant Date: 20220321

## 2023-05-17 ENCOUNTER — Ambulatory Visit: Payer: PPO

## 2023-05-17 ENCOUNTER — Encounter: Payer: Self-pay | Admitting: Internal Medicine

## 2023-05-17 DIAGNOSIS — R55 Syncope and collapse: Secondary | ICD-10-CM

## 2023-05-17 LAB — CUP PACEART REMOTE DEVICE CHECK
Date Time Interrogation Session: 20250126230528
Implantable Pulse Generator Implant Date: 20220321

## 2023-05-18 DIAGNOSIS — L578 Other skin changes due to chronic exposure to nonionizing radiation: Secondary | ICD-10-CM | POA: Diagnosis not present

## 2023-05-18 DIAGNOSIS — C44319 Basal cell carcinoma of skin of other parts of face: Secondary | ICD-10-CM | POA: Diagnosis not present

## 2023-05-18 DIAGNOSIS — C44219 Basal cell carcinoma of skin of left ear and external auricular canal: Secondary | ICD-10-CM | POA: Diagnosis not present

## 2023-05-18 DIAGNOSIS — L570 Actinic keratosis: Secondary | ICD-10-CM | POA: Diagnosis not present

## 2023-05-20 NOTE — Progress Notes (Signed)
[facility] Summary Report / Loop Recorder

## 2023-06-01 DIAGNOSIS — C44319 Basal cell carcinoma of skin of other parts of face: Secondary | ICD-10-CM | POA: Diagnosis not present

## 2023-06-15 DIAGNOSIS — C44219 Basal cell carcinoma of skin of left ear and external auricular canal: Secondary | ICD-10-CM | POA: Diagnosis not present

## 2023-06-21 ENCOUNTER — Ambulatory Visit: Payer: PPO

## 2023-06-21 DIAGNOSIS — R55 Syncope and collapse: Secondary | ICD-10-CM

## 2023-06-21 LAB — CUP PACEART REMOTE DEVICE CHECK
Date Time Interrogation Session: 20250302230345
Implantable Pulse Generator Implant Date: 20220321

## 2023-06-23 ENCOUNTER — Encounter: Payer: Self-pay | Admitting: Internal Medicine

## 2023-06-25 NOTE — Progress Notes (Signed)
 [facility] Summary Report / Loop Recorder

## 2023-07-05 DIAGNOSIS — Z933 Colostomy status: Secondary | ICD-10-CM | POA: Diagnosis not present

## 2023-07-20 DIAGNOSIS — M25511 Pain in right shoulder: Secondary | ICD-10-CM | POA: Diagnosis not present

## 2023-07-23 NOTE — Progress Notes (Signed)
 [facility] Summary Report / Loop Recorder

## 2023-07-26 ENCOUNTER — Ambulatory Visit (INDEPENDENT_AMBULATORY_CARE_PROVIDER_SITE_OTHER): Payer: PPO

## 2023-07-26 DIAGNOSIS — R55 Syncope and collapse: Secondary | ICD-10-CM

## 2023-07-26 LAB — CUP PACEART REMOTE DEVICE CHECK
Date Time Interrogation Session: 20250406230452
Implantable Pulse Generator Implant Date: 20220321

## 2023-07-27 ENCOUNTER — Encounter: Payer: Self-pay | Admitting: Internal Medicine

## 2023-08-27 DIAGNOSIS — R5383 Other fatigue: Secondary | ICD-10-CM | POA: Diagnosis not present

## 2023-08-27 DIAGNOSIS — K219 Gastro-esophageal reflux disease without esophagitis: Secondary | ICD-10-CM | POA: Diagnosis not present

## 2023-08-27 DIAGNOSIS — R002 Palpitations: Secondary | ICD-10-CM | POA: Diagnosis not present

## 2023-08-27 DIAGNOSIS — R001 Bradycardia, unspecified: Secondary | ICD-10-CM | POA: Diagnosis not present

## 2023-08-27 DIAGNOSIS — R7303 Prediabetes: Secondary | ICD-10-CM | POA: Diagnosis not present

## 2023-08-27 DIAGNOSIS — N400 Enlarged prostate without lower urinary tract symptoms: Secondary | ICD-10-CM | POA: Diagnosis not present

## 2023-08-27 DIAGNOSIS — E782 Mixed hyperlipidemia: Secondary | ICD-10-CM | POA: Diagnosis not present

## 2023-08-27 DIAGNOSIS — R600 Localized edema: Secondary | ICD-10-CM | POA: Diagnosis not present

## 2023-08-27 DIAGNOSIS — N182 Chronic kidney disease, stage 2 (mild): Secondary | ICD-10-CM | POA: Diagnosis not present

## 2023-08-30 ENCOUNTER — Ambulatory Visit (INDEPENDENT_AMBULATORY_CARE_PROVIDER_SITE_OTHER): Payer: PPO

## 2023-08-30 DIAGNOSIS — R55 Syncope and collapse: Secondary | ICD-10-CM

## 2023-08-30 LAB — CUP PACEART REMOTE DEVICE CHECK
Date Time Interrogation Session: 20250511233400
Implantable Pulse Generator Implant Date: 20220321

## 2023-08-31 ENCOUNTER — Ambulatory Visit: Payer: Self-pay | Admitting: Internal Medicine

## 2023-09-02 DIAGNOSIS — Z933 Colostomy status: Secondary | ICD-10-CM | POA: Diagnosis not present

## 2023-09-02 DIAGNOSIS — Z0000 Encounter for general adult medical examination without abnormal findings: Secondary | ICD-10-CM | POA: Diagnosis not present

## 2023-09-02 DIAGNOSIS — C189 Malignant neoplasm of colon, unspecified: Secondary | ICD-10-CM | POA: Diagnosis not present

## 2023-09-02 DIAGNOSIS — E782 Mixed hyperlipidemia: Secondary | ICD-10-CM | POA: Diagnosis not present

## 2023-09-02 DIAGNOSIS — K219 Gastro-esophageal reflux disease without esophagitis: Secondary | ICD-10-CM | POA: Diagnosis not present

## 2023-09-02 DIAGNOSIS — R600 Localized edema: Secondary | ICD-10-CM | POA: Diagnosis not present

## 2023-09-02 DIAGNOSIS — N182 Chronic kidney disease, stage 2 (mild): Secondary | ICD-10-CM | POA: Diagnosis not present

## 2023-09-02 DIAGNOSIS — R7303 Prediabetes: Secondary | ICD-10-CM | POA: Diagnosis not present

## 2023-09-02 DIAGNOSIS — N400 Enlarged prostate without lower urinary tract symptoms: Secondary | ICD-10-CM | POA: Diagnosis not present

## 2023-09-06 DIAGNOSIS — L570 Actinic keratosis: Secondary | ICD-10-CM | POA: Diagnosis not present

## 2023-09-06 DIAGNOSIS — D485 Neoplasm of uncertain behavior of skin: Secondary | ICD-10-CM | POA: Diagnosis not present

## 2023-09-06 DIAGNOSIS — L578 Other skin changes due to chronic exposure to nonionizing radiation: Secondary | ICD-10-CM | POA: Diagnosis not present

## 2023-09-09 NOTE — Progress Notes (Signed)
 [facility] Summary Report / Loop Recorder

## 2023-09-21 DIAGNOSIS — Z933 Colostomy status: Secondary | ICD-10-CM | POA: Diagnosis not present

## 2023-09-30 ENCOUNTER — Ambulatory Visit

## 2023-09-30 DIAGNOSIS — R55 Syncope and collapse: Secondary | ICD-10-CM | POA: Diagnosis not present

## 2023-10-03 ENCOUNTER — Ambulatory Visit: Payer: Self-pay | Admitting: Internal Medicine

## 2023-10-18 NOTE — Progress Notes (Signed)
 [facility] Summary Report / Loop Recorder

## 2023-11-01 ENCOUNTER — Ambulatory Visit (INDEPENDENT_AMBULATORY_CARE_PROVIDER_SITE_OTHER)

## 2023-11-01 ENCOUNTER — Ambulatory Visit: Payer: Self-pay | Admitting: Internal Medicine

## 2023-11-01 DIAGNOSIS — R55 Syncope and collapse: Secondary | ICD-10-CM

## 2023-11-01 LAB — CUP PACEART REMOTE DEVICE CHECK
Date Time Interrogation Session: 20250713233032
Implantable Pulse Generator Implant Date: 20220321

## 2023-11-19 NOTE — Progress Notes (Signed)
 [facility] Summary Report / Loop Recorder

## 2023-11-19 NOTE — Addendum Note (Signed)
 Addended by: VICCI SELLER A on: 11/19/2023 03:10 PM   Modules accepted: Orders

## 2023-11-23 DIAGNOSIS — Z933 Colostomy status: Secondary | ICD-10-CM | POA: Diagnosis not present

## 2023-12-01 DIAGNOSIS — M25511 Pain in right shoulder: Secondary | ICD-10-CM | POA: Diagnosis not present

## 2023-12-02 ENCOUNTER — Ambulatory Visit (INDEPENDENT_AMBULATORY_CARE_PROVIDER_SITE_OTHER)

## 2023-12-02 DIAGNOSIS — R55 Syncope and collapse: Secondary | ICD-10-CM

## 2023-12-02 LAB — CUP PACEART REMOTE DEVICE CHECK
Date Time Interrogation Session: 20250813231527
Implantable Pulse Generator Implant Date: 20220321

## 2023-12-04 DIAGNOSIS — D0461 Carcinoma in situ of skin of right upper limb, including shoulder: Secondary | ICD-10-CM | POA: Diagnosis not present

## 2023-12-04 DIAGNOSIS — D0462 Carcinoma in situ of skin of left upper limb, including shoulder: Secondary | ICD-10-CM | POA: Diagnosis not present

## 2023-12-05 ENCOUNTER — Ambulatory Visit: Payer: Self-pay | Admitting: Internal Medicine

## 2023-12-21 DIAGNOSIS — M25511 Pain in right shoulder: Secondary | ICD-10-CM | POA: Diagnosis not present

## 2023-12-24 DIAGNOSIS — G8929 Other chronic pain: Secondary | ICD-10-CM | POA: Diagnosis not present

## 2023-12-24 DIAGNOSIS — M25511 Pain in right shoulder: Secondary | ICD-10-CM | POA: Diagnosis not present

## 2023-12-24 DIAGNOSIS — M778 Other enthesopathies, not elsewhere classified: Secondary | ICD-10-CM | POA: Diagnosis not present

## 2024-01-03 ENCOUNTER — Ambulatory Visit (INDEPENDENT_AMBULATORY_CARE_PROVIDER_SITE_OTHER)

## 2024-01-03 DIAGNOSIS — R55 Syncope and collapse: Secondary | ICD-10-CM | POA: Diagnosis not present

## 2024-01-03 LAB — CUP PACEART REMOTE DEVICE CHECK
Date Time Interrogation Session: 20250913230323
Implantable Pulse Generator Implant Date: 20220321

## 2024-01-04 DIAGNOSIS — M9902 Segmental and somatic dysfunction of thoracic region: Secondary | ICD-10-CM | POA: Diagnosis not present

## 2024-01-04 DIAGNOSIS — M62830 Muscle spasm of back: Secondary | ICD-10-CM | POA: Diagnosis not present

## 2024-01-04 DIAGNOSIS — M9901 Segmental and somatic dysfunction of cervical region: Secondary | ICD-10-CM | POA: Diagnosis not present

## 2024-01-07 ENCOUNTER — Ambulatory Visit: Payer: Self-pay | Admitting: Internal Medicine

## 2024-01-08 NOTE — Progress Notes (Signed)
 Remote Loop Recorder Transmission

## 2024-01-12 NOTE — Progress Notes (Signed)
 Remote Loop Recorder Transmission

## 2024-01-21 DIAGNOSIS — G4733 Obstructive sleep apnea (adult) (pediatric): Secondary | ICD-10-CM | POA: Diagnosis not present

## 2024-01-27 NOTE — Progress Notes (Signed)
 Remote Loop Recorder Transmission

## 2024-02-01 ENCOUNTER — Encounter

## 2024-02-03 ENCOUNTER — Encounter

## 2024-02-07 ENCOUNTER — Ambulatory Visit: Attending: Internal Medicine

## 2024-02-07 DIAGNOSIS — R55 Syncope and collapse: Secondary | ICD-10-CM

## 2024-02-08 LAB — CUP PACEART REMOTE DEVICE CHECK
Date Time Interrogation Session: 20251021005130
Implantable Pulse Generator Implant Date: 20220321
Zone Setting Status: 755011
Zone Setting Status: 755011
Zone Setting Status: 755011
Zone Setting Status: 755011

## 2024-02-09 ENCOUNTER — Ambulatory Visit: Payer: Self-pay | Admitting: Internal Medicine

## 2024-02-11 NOTE — Progress Notes (Signed)
 Remote Loop Recorder Transmission

## 2024-02-23 DIAGNOSIS — Z933 Colostomy status: Secondary | ICD-10-CM | POA: Diagnosis not present

## 2024-03-03 ENCOUNTER — Encounter

## 2024-03-06 ENCOUNTER — Encounter

## 2024-03-08 DIAGNOSIS — L570 Actinic keratosis: Secondary | ICD-10-CM | POA: Diagnosis not present

## 2024-03-08 DIAGNOSIS — L578 Other skin changes due to chronic exposure to nonionizing radiation: Secondary | ICD-10-CM | POA: Diagnosis not present

## 2024-03-08 DIAGNOSIS — C44329 Squamous cell carcinoma of skin of other parts of face: Secondary | ICD-10-CM | POA: Diagnosis not present

## 2024-03-09 ENCOUNTER — Ambulatory Visit: Payer: Self-pay | Admitting: Internal Medicine

## 2024-03-09 ENCOUNTER — Ambulatory Visit: Attending: Internal Medicine

## 2024-03-09 DIAGNOSIS — R55 Syncope and collapse: Secondary | ICD-10-CM | POA: Diagnosis not present

## 2024-03-09 LAB — CUP PACEART REMOTE DEVICE CHECK
Date Time Interrogation Session: 20251119231000
Implantable Pulse Generator Implant Date: 20220321

## 2024-03-13 NOTE — Progress Notes (Signed)
 Remote Loop Recorder Transmission

## 2024-03-27 DIAGNOSIS — C44329 Squamous cell carcinoma of skin of other parts of face: Secondary | ICD-10-CM | POA: Diagnosis not present

## 2024-04-03 ENCOUNTER — Encounter

## 2024-04-09 ENCOUNTER — Ambulatory Visit

## 2024-04-09 DIAGNOSIS — R55 Syncope and collapse: Secondary | ICD-10-CM | POA: Diagnosis not present

## 2024-04-10 LAB — CUP PACEART REMOTE DEVICE CHECK
Date Time Interrogation Session: 20251220230447
Implantable Pulse Generator Implant Date: 20220321

## 2024-04-10 NOTE — Progress Notes (Signed)
 Remote Loop Recorder Transmission

## 2024-04-16 ENCOUNTER — Ambulatory Visit: Payer: Self-pay | Admitting: Internal Medicine

## 2024-05-04 ENCOUNTER — Encounter

## 2024-05-10 ENCOUNTER — Ambulatory Visit

## 2024-05-10 DIAGNOSIS — R55 Syncope and collapse: Secondary | ICD-10-CM | POA: Diagnosis not present

## 2024-05-11 LAB — CUP PACEART REMOTE DEVICE CHECK
Date Time Interrogation Session: 20260120230946
Implantable Pulse Generator Implant Date: 20220321

## 2024-05-13 NOTE — Progress Notes (Signed)
 Remote Loop Recorder Transmission

## 2024-05-15 ENCOUNTER — Ambulatory Visit: Payer: Self-pay | Admitting: Cardiology

## 2024-05-24 ENCOUNTER — Telehealth: Payer: Self-pay | Admitting: Student

## 2024-05-24 NOTE — Telephone Encounter (Signed)
 Pt wife stating pt dicussed getting loop recorder out. Pt is wanting to move forward with this. Please advise.

## 2024-05-24 NOTE — Telephone Encounter (Signed)
 Called and spoke with pt, wife in background.  Aware will forward to device clinic to cancel remaining remotes, pt does not want to be charged for this anymore. Plan is to explant ILR, per pt request, at 3/27 OV w/ Jodie Passey, PA. (ILR implanted 06/2020/syncope)  Will forward to billing for precert Forwarding to provider in case he has other advisement.

## 2024-05-25 NOTE — Telephone Encounter (Signed)
 Call x1; lvmtcb to reschedule 3/27 EP APP w/ MD for ILR Explant d/t patient preference - see 2/4 phone note.

## 2024-05-25 NOTE — Telephone Encounter (Signed)
 Noted, ILR appts cancelled.  Has been marked inactive in Paceart. Still active in [facility] until he is actually explanted.

## 2024-06-04 ENCOUNTER — Encounter

## 2024-06-10 ENCOUNTER — Ambulatory Visit

## 2024-07-05 ENCOUNTER — Encounter

## 2024-07-11 ENCOUNTER — Ambulatory Visit

## 2024-07-14 ENCOUNTER — Ambulatory Visit: Admitting: Student

## 2024-08-05 ENCOUNTER — Encounter
